# Patient Record
Sex: Female | Born: 1985 | Race: Black or African American | Hispanic: No | Marital: Married | Smoking: Current every day smoker
Health system: Southern US, Community
[De-identification: ages and names within clinical notes are randomized; demographics above are authoritative.]

## PROBLEM LIST (undated history)

## (undated) DIAGNOSIS — I10 Essential (primary) hypertension: Secondary | ICD-10-CM

## (undated) DIAGNOSIS — G039 Meningitis, unspecified: Secondary | ICD-10-CM

## (undated) DIAGNOSIS — O24419 Gestational diabetes mellitus in pregnancy, unspecified control: Secondary | ICD-10-CM

## (undated) HISTORY — DX: Essential (primary) hypertension: I10

## (undated) HISTORY — PX: NO PAST SURGERIES: SHX2092

## (undated) HISTORY — DX: Meningitis, unspecified: G03.9

---

## 2007-10-10 DIAGNOSIS — G039 Meningitis, unspecified: Secondary | ICD-10-CM

## 2007-10-10 HISTORY — DX: Meningitis, unspecified: G03.9

## 2012-10-09 NOTE — L&D Delivery Note (Signed)
Delivery Note At 4:21 AM a viable female was delivered via Vaginal, Spontaneous Delivery (Presentation: Left Occiput Anterior).  APGAR: , ; weight .   Placenta status: , .  Cord: 3 vessels with the following complications: None.  Cord pH: not done  Anesthesia:   Episiotomy: None Lacerations:  Suture Repair: 2.0 Est. Blood Loss (mL):   Mom to postpartum.  Baby to Couplet care / Skin to Skin.  MARSHALL,BERNARD A 08/19/2013, 4:29 AM

## 2013-02-25 ENCOUNTER — Ambulatory Visit (INDEPENDENT_AMBULATORY_CARE_PROVIDER_SITE_OTHER): Payer: BC Managed Care – PPO | Admitting: Obstetrics

## 2013-02-25 ENCOUNTER — Encounter: Payer: Self-pay | Admitting: Obstetrics

## 2013-02-25 VITALS — BP 124/88 | Temp 98.1°F | Ht 62.0 in | Wt 166.8 lb

## 2013-02-25 DIAGNOSIS — N760 Acute vaginitis: Secondary | ICD-10-CM

## 2013-02-25 DIAGNOSIS — Z113 Encounter for screening for infections with a predominantly sexual mode of transmission: Secondary | ICD-10-CM

## 2013-02-25 DIAGNOSIS — Z3401 Encounter for supervision of normal first pregnancy, first trimester: Secondary | ICD-10-CM

## 2013-02-25 DIAGNOSIS — Z124 Encounter for screening for malignant neoplasm of cervix: Secondary | ICD-10-CM

## 2013-02-25 DIAGNOSIS — Z3201 Encounter for pregnancy test, result positive: Secondary | ICD-10-CM

## 2013-02-25 LAB — POCT URINALYSIS DIPSTICK
Leukocytes, UA: NEGATIVE
Urobilinogen, UA: NEGATIVE

## 2013-02-25 NOTE — Progress Notes (Signed)
Pulse- 87 . Subjective:    Leslie Cook is being seen today for her first obstetrical visit.  This is not a planned pregnancy. She is at [redacted]w[redacted]d gestation. Her obstetrical history is significant for none. Relationship with FOB: significant other, living together. Patient does intend to breast feed. Pregnancy history fully reviewed.  Menstrual History: OB History   Grav Para Term Preterm Abortions TAB SAB Ect Mult Living   1               Menarche age: 27 Patient's last menstrual period was 12/09/2012.    The following portions of the patient's history were reviewed and updated as appropriate: allergies, current medications, past family history, past medical history, past social history, past surgical history and problem list.  Review of Systems Pertinent items are noted in HPI.    Objective:    General appearance: alert and no distress Abdomen: normal findings: soft, non-tender Pelvic: cervix normal in appearance, external genitalia normal, no adnexal masses or tenderness, no cervical motion tenderness, vagina normal without discharge and uterus 11 weeks size,soft, nontender.    Assessment:    Pregnancy at [redacted]w[redacted]d weeks    Plan:    Initial labs drawn. Prenatal vitamins. Problem list reviewed and updated. AFP3 discussed: requested. Role of ultrasound in pregnancy discussed; fetal survey: requested. Amniocentesis discussed: not indicated. Follow up in 4 weeks. 50% of 20 min visit spent on counseling and coordination of care.

## 2013-02-26 ENCOUNTER — Encounter: Payer: Self-pay | Admitting: Obstetrics

## 2013-02-26 LAB — OB RESULTS CONSOLE GC/CHLAMYDIA
Chlamydia: NEGATIVE
Gonorrhea: NEGATIVE

## 2013-02-26 LAB — OBSTETRIC PANEL
Basophils Relative: 0 % (ref 0–1)
Eosinophils Absolute: 0 10*3/uL (ref 0.0–0.7)
Eosinophils Relative: 0 % (ref 0–5)
Lymphs Abs: 1.8 10*3/uL (ref 0.7–4.0)
MCH: 30.3 pg (ref 26.0–34.0)
MCHC: 34.1 g/dL (ref 30.0–36.0)
MCV: 88.9 fL (ref 78.0–100.0)
Platelets: 360 10*3/uL (ref 150–400)
RBC: 4.32 MIL/uL (ref 3.87–5.11)
RDW: 12.7 % (ref 11.5–15.5)
Rh Type: POSITIVE

## 2013-02-26 LAB — WET PREP BY MOLECULAR PROBE
Candida species: NEGATIVE
Gardnerella vaginalis: NEGATIVE
Trichomonas vaginosis: NEGATIVE

## 2013-02-26 LAB — PAP IG W/ RFLX HPV ASCU

## 2013-02-26 LAB — HIV ANTIBODY (ROUTINE TESTING W REFLEX): HIV: NONREACTIVE

## 2013-02-27 LAB — CULTURE, OB URINE
Colony Count: NO GROWTH
Organism ID, Bacteria: NO GROWTH

## 2013-02-27 LAB — HEMOGLOBINOPATHY EVALUATION
Hgb A: 97.2 % (ref 96.8–97.8)
Hgb S Quant: 0 %

## 2013-03-18 ENCOUNTER — Ambulatory Visit (INDEPENDENT_AMBULATORY_CARE_PROVIDER_SITE_OTHER): Payer: BC Managed Care – PPO | Admitting: Obstetrics

## 2013-03-18 ENCOUNTER — Encounter: Payer: Self-pay | Admitting: Obstetrics

## 2013-03-18 VITALS — BP 121/77 | Temp 98.2°F | Wt 169.2 lb

## 2013-03-18 DIAGNOSIS — Z3402 Encounter for supervision of normal first pregnancy, second trimester: Secondary | ICD-10-CM

## 2013-03-18 DIAGNOSIS — Z3400 Encounter for supervision of normal first pregnancy, unspecified trimester: Secondary | ICD-10-CM

## 2013-03-18 LAB — POCT URINALYSIS DIPSTICK
Glucose, UA: NEGATIVE
Nitrite, UA: NEGATIVE
Protein, UA: NEGATIVE
Spec Grav, UA: 1.02
Urobilinogen, UA: NEGATIVE

## 2013-03-18 NOTE — Progress Notes (Signed)
Pulse: 85

## 2013-04-01 ENCOUNTER — Ambulatory Visit (INDEPENDENT_AMBULATORY_CARE_PROVIDER_SITE_OTHER): Payer: BC Managed Care – PPO | Admitting: Obstetrics

## 2013-04-01 VITALS — BP 111/86 | Temp 99.2°F | Wt 170.0 lb

## 2013-04-01 DIAGNOSIS — Z3401 Encounter for supervision of normal first pregnancy, first trimester: Secondary | ICD-10-CM

## 2013-04-01 DIAGNOSIS — Z3402 Encounter for supervision of normal first pregnancy, second trimester: Secondary | ICD-10-CM

## 2013-04-01 DIAGNOSIS — Z3400 Encounter for supervision of normal first pregnancy, unspecified trimester: Secondary | ICD-10-CM

## 2013-04-01 DIAGNOSIS — Z369 Encounter for antenatal screening, unspecified: Secondary | ICD-10-CM

## 2013-04-01 LAB — POCT URINALYSIS DIPSTICK
Ketones, UA: NEGATIVE
Urobilinogen, UA: NEGATIVE
pH, UA: 6

## 2013-04-01 NOTE — Progress Notes (Signed)
Pulse 86 patient states she has no concerns today

## 2013-04-02 ENCOUNTER — Encounter: Payer: Self-pay | Admitting: Obstetrics

## 2013-04-02 LAB — AFP, QUAD SCREEN
AFP: 30.9 IU/mL
Curr Gest Age: 16.1 wks.days
Down Syndrome Scr Risk Est: 1:10800 {titer}
HCG, Total: 20720 m[IU]/mL
MoM for hCG: 0.87
Osb Risk: 1:30600 {titer}
Trisomy 18 (Edward) Syndrome Interp.: 1:72000 {titer}
uE3 Value: 0.6 ng/mL

## 2013-04-28 ENCOUNTER — Other Ambulatory Visit: Payer: Self-pay | Admitting: *Deleted

## 2013-04-28 DIAGNOSIS — Z3402 Encounter for supervision of normal first pregnancy, second trimester: Secondary | ICD-10-CM

## 2013-04-29 ENCOUNTER — Ambulatory Visit (INDEPENDENT_AMBULATORY_CARE_PROVIDER_SITE_OTHER): Payer: BC Managed Care – PPO | Admitting: Obstetrics

## 2013-04-29 ENCOUNTER — Encounter: Payer: Self-pay | Admitting: Obstetrics

## 2013-04-29 ENCOUNTER — Ambulatory Visit (INDEPENDENT_AMBULATORY_CARE_PROVIDER_SITE_OTHER): Payer: BC Managed Care – PPO

## 2013-04-29 VITALS — BP 125/84 | Temp 98.4°F | Wt 177.0 lb

## 2013-04-29 DIAGNOSIS — Z3400 Encounter for supervision of normal first pregnancy, unspecified trimester: Secondary | ICD-10-CM

## 2013-04-29 DIAGNOSIS — Z369 Encounter for antenatal screening, unspecified: Secondary | ICD-10-CM

## 2013-04-29 DIAGNOSIS — Z3402 Encounter for supervision of normal first pregnancy, second trimester: Secondary | ICD-10-CM

## 2013-04-29 NOTE — Progress Notes (Signed)
Pulse: 75

## 2013-05-01 ENCOUNTER — Encounter: Payer: Self-pay | Admitting: Obstetrics

## 2013-05-01 LAB — US OB DETAIL + 14 WK

## 2013-05-09 ENCOUNTER — Encounter: Payer: Self-pay | Admitting: Obstetrics

## 2013-05-09 LAB — US OB DETAIL + 14 WK

## 2013-05-12 ENCOUNTER — Inpatient Hospital Stay (HOSPITAL_COMMUNITY)
Admission: AD | Admit: 2013-05-12 | Discharge: 2013-05-12 | Disposition: A | Discharge status: Home / Self Care | Payer: BC Managed Care – PPO | Source: Ambulatory Visit | Attending: Obstetrics & Gynecology | Admitting: Obstetrics & Gynecology

## 2013-05-12 ENCOUNTER — Encounter (HOSPITAL_COMMUNITY): Payer: Self-pay

## 2013-05-12 DIAGNOSIS — O2342 Unspecified infection of urinary tract in pregnancy, second trimester: Secondary | ICD-10-CM

## 2013-05-12 DIAGNOSIS — N390 Urinary tract infection, site not specified: Secondary | ICD-10-CM | POA: Insufficient documentation

## 2013-05-12 DIAGNOSIS — R1032 Left lower quadrant pain: Secondary | ICD-10-CM | POA: Insufficient documentation

## 2013-05-12 DIAGNOSIS — N949 Unspecified condition associated with female genital organs and menstrual cycle: Secondary | ICD-10-CM

## 2013-05-12 DIAGNOSIS — O2390 Unspecified genitourinary tract infection in pregnancy, unspecified trimester: Secondary | ICD-10-CM | POA: Insufficient documentation

## 2013-05-12 LAB — URINALYSIS, ROUTINE W REFLEX MICROSCOPIC
Bilirubin Urine: NEGATIVE
Ketones, ur: NEGATIVE mg/dL
Nitrite: NEGATIVE
Specific Gravity, Urine: 1.025 (ref 1.005–1.030)
Urobilinogen, UA: 0.2 mg/dL (ref 0.0–1.0)

## 2013-05-12 MED ORDER — NITROFURANTOIN MONOHYD MACRO 100 MG PO CAPS: 100.0000 mg | ORAL_CAPSULE | Freq: Two times a day (BID) | ORAL | Status: DC

## 2013-05-12 NOTE — MAU Provider Note (Signed)
History     CSN: [id number]  Arrival date and time: 05/12/13 1202   First Provider Initiated Contact with Patient 05/12/13 1319      Chief Complaint  Patient presents with  . Abdominal Pain   HPI  Pt is a G1P0 at 22.0 wks IUP here with report of pain in LLQ x one week.  She states that prior to today, it was painful to the touch, now its constant and intensifies with walking. She denies vaginal bleeding, lof or abnormal discharge,.  No report of dysuria. + fetal movement.   No complications during pregnancy.   Past Medical History  Diagnosis Date  . Meningitis     Past Surgical History  Procedure Laterality Date  . No past surgeries      Family History  Problem Relation Age of Onset  . Hypertension Mother   . Hypertension Father   . Diabetes Father   . Diabetes Maternal Grandmother     History  Substance Use Topics  . Smoking status: Never Smoker   . Smokeless tobacco: Not on file  . Alcohol Use: No    Allergies: No Known Allergies  Prescriptions prior to admission  Medication Sig Dispense Refill  . Prenatal Vit-Fe Fumarate-FA (PRENATAL MULTIVITAMIN) TABS Take 1 tablet by mouth daily at 12 noon.        Review of Systems  Gastrointestinal: Positive for abdominal pain (left groin pain). Negative for nausea and vomiting.  Genitourinary: Negative.   All other systems reviewed and are negative.   Physical Exam   Blood pressure 133/73, pulse 84, temperature 100.2 F (37.9 C), temperature source Oral, resp. rate 18, height 5\' 2"  (1.575 m), last menstrual period 12/09/2012, SpO2 100.00%.  Physical Exam  Constitutional: She is oriented to person, place, and time. She appears well-developed and well-nourished. No distress.  HENT:  Head: Normocephalic.  Neck: Normal range of motion. Neck supple.  Cardiovascular: Normal rate and regular rhythm.   Murmur (Grade II SEM) heard. Respiratory: Effort normal and breath sounds normal.  GI: Soft. There is no  tenderness.  Genitourinary: No bleeding around the vagina. Vaginal discharge (mucusy) found.  Musculoskeletal: Normal range of motion. She exhibits no edema.  Neurological: She is alert and oriented to person, place, and time.  Skin: Skin is warm and dry.  Dilation: Closed Effacement (%): 50 Cervical Position: Middle Exam by:: Roney Marion, CNM   MAU Course  Procedures Results for orders placed during the hospital encounter of 05/12/13 (from the past 24 hour(s))  URINALYSIS, ROUTINE W REFLEX MICROSCOPIC     Status: Abnormal   Collection Time    05/12/13 12:18 PM      Result Value Range   Color, Urine YELLOW  YELLOW   APPearance CLEAR  CLEAR   Specific Gravity, Urine 1.025  1.005 - 1.030   pH 6.0  5.0 - 8.0   Glucose, UA NEGATIVE  NEGATIVE mg/dL   Hgb urine dipstick NEGATIVE  NEGATIVE   Bilirubin Urine NEGATIVE  NEGATIVE   Ketones, ur NEGATIVE  NEGATIVE mg/dL   Protein, ur NEGATIVE  NEGATIVE mg/dL   Urobilinogen, UA 0.2  0.0 - 1.0 mg/dL   Nitrite NEGATIVE  NEGATIVE   Leukocytes, UA MODERATE (*) NEGATIVE  URINE MICROSCOPIC-ADD ON     Status: Abnormal   Collection Time    05/12/13 12:18 PM      Result Value Range   Squamous Epithelial / LPF FEW (*) RARE   WBC, UA 3-6  <3 WBC/hpf  Bacteria, UA FEW (*) RARE    Assessment and Plan  UTI in Pregnancy Round Ligament Pain  Plan: DC to home RX Macrobid 100 mg BID x 7 days Urine culture Keep scheduled appt with Dr. Clearance Coots   [facility] 05/12/2013, 1:20 PM

## 2013-05-12 NOTE — MAU Note (Signed)
Patient is in with c/o llq to left groin pressure pain that have been going on for a week. She states that prior to today, the pain is to touch, now its constant and intensifies with walking. She denies vaginal bleeding, lof or abnormal discharge, denies dysuria. She reports good fetal movement. Abdomen is soft and non-tender.

## 2013-05-14 LAB — URINE CULTURE

## 2013-05-29 ENCOUNTER — Other Ambulatory Visit: Payer: BC Managed Care – PPO

## 2013-05-29 ENCOUNTER — Encounter: Payer: Self-pay | Admitting: Obstetrics

## 2013-05-29 ENCOUNTER — Ambulatory Visit (INDEPENDENT_AMBULATORY_CARE_PROVIDER_SITE_OTHER): Payer: BC Managed Care – PPO | Admitting: Obstetrics

## 2013-05-29 VITALS — BP 116/79 | Temp 98.7°F | Wt 185.0 lb

## 2013-05-29 DIAGNOSIS — Z3402 Encounter for supervision of normal first pregnancy, second trimester: Secondary | ICD-10-CM

## 2013-05-29 DIAGNOSIS — Z3400 Encounter for supervision of normal first pregnancy, unspecified trimester: Secondary | ICD-10-CM

## 2013-05-29 LAB — CBC
HCT: 34.7 % — ABNORMAL LOW (ref 36.0–46.0)
Hemoglobin: 11.5 g/dL — ABNORMAL LOW (ref 12.0–15.0)
MCH: 29.6 pg (ref 26.0–34.0)
MCHC: 33.1 g/dL (ref 30.0–36.0)

## 2013-05-29 LAB — POCT URINALYSIS DIPSTICK
Blood, UA: NEGATIVE
Nitrite, UA: NEGATIVE
Urobilinogen, UA: NEGATIVE
pH, UA: 8

## 2013-05-29 NOTE — Addendum Note (Signed)
Addended by: George Hugh on: 05/29/2013 01:07 PM   Modules accepted: Orders

## 2013-05-29 NOTE — Progress Notes (Signed)
P- 90 Pt states she has been having some pelvic pain. Pt states it is tender to touch.

## 2013-05-30 LAB — RPR

## 2013-06-12 ENCOUNTER — Encounter: Payer: Self-pay | Admitting: Obstetrics

## 2013-06-12 ENCOUNTER — Ambulatory Visit (INDEPENDENT_AMBULATORY_CARE_PROVIDER_SITE_OTHER): Payer: BC Managed Care – PPO | Admitting: Obstetrics

## 2013-06-12 VITALS — BP 132/82 | Temp 99.1°F | Wt 187.0 lb

## 2013-06-12 DIAGNOSIS — Z3403 Encounter for supervision of normal first pregnancy, third trimester: Secondary | ICD-10-CM

## 2013-06-12 DIAGNOSIS — Z3400 Encounter for supervision of normal first pregnancy, unspecified trimester: Secondary | ICD-10-CM

## 2013-06-12 DIAGNOSIS — Z3402 Encounter for supervision of normal first pregnancy, second trimester: Secondary | ICD-10-CM

## 2013-06-12 LAB — POCT URINALYSIS DIPSTICK
Bilirubin, UA: NEGATIVE
Blood, UA: NEGATIVE
Glucose, UA: NEGATIVE
Nitrite, UA: NEGATIVE
Spec Grav, UA: 1.02

## 2013-06-12 NOTE — Progress Notes (Signed)
P-95 

## 2013-06-30 ENCOUNTER — Encounter: Payer: Self-pay | Admitting: Obstetrics

## 2013-06-30 ENCOUNTER — Ambulatory Visit (INDEPENDENT_AMBULATORY_CARE_PROVIDER_SITE_OTHER): Payer: BC Managed Care – PPO | Admitting: Obstetrics

## 2013-06-30 VITALS — BP 115/77 | Temp 98.8°F | Wt 189.0 lb

## 2013-06-30 DIAGNOSIS — Z23 Encounter for immunization: Secondary | ICD-10-CM

## 2013-06-30 DIAGNOSIS — Z3403 Encounter for supervision of normal first pregnancy, third trimester: Secondary | ICD-10-CM

## 2013-06-30 DIAGNOSIS — Z3400 Encounter for supervision of normal first pregnancy, unspecified trimester: Secondary | ICD-10-CM

## 2013-06-30 LAB — POCT URINALYSIS DIPSTICK
Glucose, UA: NEGATIVE
Spec Grav, UA: 1.02
Urobilinogen, UA: NEGATIVE

## 2013-06-30 NOTE — Progress Notes (Signed)
P 85 Patient request information on natural child birth. Patient had an unusual SOB episode at school today.

## 2013-07-15 ENCOUNTER — Ambulatory Visit (INDEPENDENT_AMBULATORY_CARE_PROVIDER_SITE_OTHER): Payer: BC Managed Care – PPO | Admitting: Obstetrics

## 2013-07-15 ENCOUNTER — Encounter: Payer: Self-pay | Admitting: Obstetrics

## 2013-07-15 VITALS — BP 131/71 | Temp 98.1°F | Wt 190.0 lb

## 2013-07-15 DIAGNOSIS — Z3400 Encounter for supervision of normal first pregnancy, unspecified trimester: Secondary | ICD-10-CM

## 2013-07-15 DIAGNOSIS — Z3403 Encounter for supervision of normal first pregnancy, third trimester: Secondary | ICD-10-CM

## 2013-07-15 LAB — POCT URINALYSIS DIPSTICK
Ketones, UA: NEGATIVE
Nitrite, UA: NEGATIVE
Protein, UA: NEGATIVE
Urobilinogen, UA: NEGATIVE

## 2013-07-15 NOTE — Progress Notes (Signed)
Pulse: 90

## 2013-07-29 ENCOUNTER — Encounter: Payer: BC Managed Care – PPO | Admitting: Obstetrics

## 2013-07-30 ENCOUNTER — Ambulatory Visit (INDEPENDENT_AMBULATORY_CARE_PROVIDER_SITE_OTHER): Payer: BC Managed Care – PPO | Admitting: Obstetrics

## 2013-07-30 ENCOUNTER — Encounter: Payer: Self-pay | Admitting: Obstetrics

## 2013-07-30 VITALS — BP 153/84 | Temp 98.0°F | Wt 190.0 lb

## 2013-07-30 DIAGNOSIS — Z3480 Encounter for supervision of other normal pregnancy, unspecified trimester: Secondary | ICD-10-CM

## 2013-07-30 DIAGNOSIS — Z3483 Encounter for supervision of other normal pregnancy, third trimester: Secondary | ICD-10-CM

## 2013-07-30 LAB — POCT URINALYSIS DIPSTICK
Bilirubin, UA: NEGATIVE
Blood, UA: NEGATIVE
Nitrite, UA: NEGATIVE
Urobilinogen, UA: NEGATIVE
pH, UA: 7

## 2013-07-30 NOTE — Progress Notes (Signed)
P 102 Patient states she felt a little "crampy" yesterday.

## 2013-08-14 ENCOUNTER — Ambulatory Visit (INDEPENDENT_AMBULATORY_CARE_PROVIDER_SITE_OTHER): Payer: BC Managed Care – PPO | Admitting: Obstetrics

## 2013-08-14 ENCOUNTER — Encounter: Payer: Self-pay | Admitting: Obstetrics

## 2013-08-14 VITALS — BP 138/82 | Temp 97.8°F | Wt 200.0 lb

## 2013-08-14 DIAGNOSIS — Z3403 Encounter for supervision of normal first pregnancy, third trimester: Secondary | ICD-10-CM

## 2013-08-14 DIAGNOSIS — Z3400 Encounter for supervision of normal first pregnancy, unspecified trimester: Secondary | ICD-10-CM

## 2013-08-14 LAB — POCT URINALYSIS DIPSTICK
Nitrite, UA: NEGATIVE
Spec Grav, UA: 1.015
Urobilinogen, UA: NEGATIVE
pH, UA: 6

## 2013-08-14 NOTE — Progress Notes (Signed)
Pulse- 94 

## 2013-08-17 ENCOUNTER — Inpatient Hospital Stay (HOSPITAL_COMMUNITY)
Admission: AD | Admit: 2013-08-17 | Discharge: 2013-08-21 | DRG: 775 | Disposition: A | Discharge status: Home / Self Care | Payer: BC Managed Care – PPO | Source: Ambulatory Visit | Attending: Obstetrics | Admitting: Obstetrics

## 2013-08-17 ENCOUNTER — Encounter (HOSPITAL_COMMUNITY): Payer: Self-pay | Admitting: *Deleted

## 2013-08-17 DIAGNOSIS — Z22330 Carrier of Group B streptococcus: Secondary | ICD-10-CM

## 2013-08-17 DIAGNOSIS — O4290 Premature rupture of membranes, unspecified as to length of time between rupture and onset of labor, unspecified weeks of gestation: Principal | ICD-10-CM | POA: Diagnosis present

## 2013-08-17 DIAGNOSIS — O99892 Other specified diseases and conditions complicating childbirth: Secondary | ICD-10-CM | POA: Diagnosis present

## 2013-08-17 LAB — CBC
HCT: 34.5 % — ABNORMAL LOW (ref 36.0–46.0)
MCH: 28.3 pg (ref 26.0–34.0)
MCV: 85 fL (ref 78.0–100.0)
Platelets: 329 10*3/uL (ref 150–400)
RDW: 13.5 % (ref 11.5–15.5)

## 2013-08-17 MED ORDER — FLEET ENEMA 7-19 GM/118ML RE ENEM: 1.0000 | ENEMA | RECTAL | Status: DC | PRN

## 2013-08-17 MED ORDER — IBUPROFEN 600 MG PO TABS
600.0000 mg | ORAL_TABLET | Freq: Four times a day (QID) | ORAL | Status: DC | PRN
  Administered 2013-08-19: 600 mg via ORAL
  Filled 2013-08-17: qty 1

## 2013-08-17 MED ORDER — LIDOCAINE HCL (PF) 1 % IJ SOLN
30.0000 mL | INTRAMUSCULAR | Status: DC | PRN
  Filled 2013-08-17 (×2): qty 30

## 2013-08-17 MED ORDER — LACTATED RINGERS IV SOLN: 500.0000 mL | INTRAVENOUS | Status: DC | PRN

## 2013-08-17 MED ORDER — OXYCODONE-ACETAMINOPHEN 5-325 MG PO TABS: 1.0000 | ORAL_TABLET | ORAL | Status: DC | PRN

## 2013-08-17 MED ORDER — IBUPROFEN 600 MG PO TABS: 600.0000 mg | ORAL_TABLET | Freq: Four times a day (QID) | ORAL | Status: DC | PRN

## 2013-08-17 MED ORDER — CITRIC ACID-SODIUM CITRATE 334-500 MG/5ML PO SOLN: 30.0000 mL | ORAL | Status: DC | PRN

## 2013-08-17 MED ORDER — LIDOCAINE HCL (PF) 1 % IJ SOLN: 30.0000 mL | INTRAMUSCULAR | Status: DC | PRN

## 2013-08-17 MED ORDER — ONDANSETRON HCL 4 MG/2ML IJ SOLN: 4.0000 mg | Freq: Four times a day (QID) | INTRAMUSCULAR | Status: DC | PRN

## 2013-08-17 MED ORDER — PENICILLIN G POTASSIUM 5000000 UNITS IJ SOLR
5.0000 10*6.[IU] | Freq: Once | INTRAVENOUS | Status: AC
  Administered 2013-08-17: 5 10*6.[IU] via INTRAVENOUS
  Filled 2013-08-17: qty 5

## 2013-08-17 MED ORDER — ACETAMINOPHEN 325 MG PO TABS: 650.0000 mg | ORAL_TABLET | ORAL | Status: DC | PRN

## 2013-08-17 MED ORDER — OXYTOCIN BOLUS FROM INFUSION
500.0000 mL | INTRAVENOUS | Status: DC
  Administered 2013-08-19 (×2): 500 mL via INTRAVENOUS

## 2013-08-17 MED ORDER — OXYTOCIN 40 UNITS IN LACTATED RINGERS INFUSION - SIMPLE MED
1.0000 m[IU]/min | INTRAVENOUS | Status: DC
  Administered 2013-08-17: 2 m[IU]/min via INTRAVENOUS
  Filled 2013-08-17: qty 1000

## 2013-08-17 MED ORDER — BUTORPHANOL TARTRATE 1 MG/ML IJ SOLN
1.0000 mg | INTRAMUSCULAR | Status: DC | PRN
  Administered 2013-08-18 – 2013-08-19 (×7): 1 mg via INTRAVENOUS
  Filled 2013-08-17 (×7): qty 1

## 2013-08-17 MED ORDER — LACTATED RINGERS IV SOLN
INTRAVENOUS | Status: DC
  Administered 2013-08-18 – 2013-08-19 (×4): via INTRAVENOUS

## 2013-08-17 MED ORDER — TERBUTALINE SULFATE 1 MG/ML IJ SOLN: 0.2500 mg | Freq: Once | INTRAMUSCULAR | Status: AC | PRN

## 2013-08-17 MED ORDER — OXYTOCIN BOLUS FROM INFUSION: 500.0000 mL | INTRAVENOUS | Status: DC

## 2013-08-17 MED ORDER — LACTATED RINGERS IV SOLN
INTRAVENOUS | Status: DC
  Administered 2013-08-17: 22:00:00 via INTRAVENOUS

## 2013-08-17 MED ORDER — OXYTOCIN 40 UNITS IN LACTATED RINGERS INFUSION - SIMPLE MED
62.5000 mL/h | INTRAVENOUS | Status: DC
  Filled 2013-08-17: qty 1000

## 2013-08-17 MED ORDER — OXYTOCIN 40 UNITS IN LACTATED RINGERS INFUSION - SIMPLE MED: 62.5000 mL/h | INTRAVENOUS | Status: DC

## 2013-08-17 MED ORDER — PENICILLIN G POTASSIUM 5000000 UNITS IJ SOLR
2.5000 10*6.[IU] | INTRAVENOUS | Status: DC
  Administered 2013-08-18 – 2013-08-19 (×7): 2.5 10*6.[IU] via INTRAVENOUS
  Filled 2013-08-17 (×10): qty 2.5

## 2013-08-17 NOTE — H&P (Signed)
Leslie Cook is a 27 y.o. female presenting for rupture of membranes. Maternal Medical History:  Reason for admission: Rupture of membranes and nausea.   Contractions: Frequency: irregular.    Fetal activity: Perceived fetal activity is normal.    Prenatal complications: Infection: asymptomatic bacteruria.   Prenatal Complications - Diabetes: none.    OB History   Grav Para Term Preterm Abortions TAB SAB Ect Mult Living   1              Past Medical History  Diagnosis Date  . Meningitis    Past Surgical History  Procedure Laterality Date  . No past surgeries     Family History: family history includes Diabetes in her father and maternal grandmother; Hypertension in her father and mother. Social History:  reports that she has never smoked. She does not have any smokeless tobacco history on file. She reports that she does not drink alcohol or use illicit drugs.     Review of Systems  Constitutional: Negative for fever.  Eyes: Negative for blurred vision.  Respiratory: Negative for shortness of breath.   Gastrointestinal: Positive for nausea. Negative for vomiting.  Skin: Negative for rash.  Neurological: Negative for headaches.    Dilation: 1 Effacement (%): Thick Station: -3 Exam by:: Thressa Sheller, CNM Blood pressure 132/84, pulse 104, temperature 98.7 F (37.1 C), temperature source Oral, resp. rate 18, height 5\' 2"  (1.575 m), weight 89.086 kg (196 lb 6.4 oz), last menstrual period 12/09/2012. Maternal Exam:  Uterine Assessment: Contraction frequency is irregular.   Abdomen: Fetal presentation: vertex  Introitus: Ferning test: positive.  Amniotic fluid character: bloody.  Cervix: Cervix evaluated by digital exam.     Fetal Exam Fetal Monitor Review: Variability: moderate (6-25 bpm).   Pattern: accelerations present and no decelerations.    Fetal State Assessment: Category I - tracings are normal.     Physical Exam  Constitutional: She appears  well-developed.  HENT:  Head: Normocephalic.  Neck: Neck supple. No thyromegaly present.  Cardiovascular: Normal rate and regular rhythm.   Respiratory: Breath sounds normal.  GI: Soft. Bowel sounds are normal.  Skin: No rash noted.    Prenatal labs: ABO, Rh: A/POS/-- (05/20 1327) Antibody: NEG (05/20 1327) Rubella: 6.02 (05/20 1327) RPR: NON REAC (08/21 1343)  HBsAg: NEGATIVE (05/20 1327)  HIV: NON REACTIVE (08/21 1343)  GBS: POSITIVE (11/06 1604)   Assessment/Plan: [place] @ [redacted]w[redacted]d.  PPROM.  GBS positive.  Category I FHT.  Admit PCN GBS prophylaxis Augmentation of labor with low dose Pitocin per protocol   JACKSON-MOORE,Jilian West A 08/17/2013, 9:24 PM

## 2013-08-17 NOTE — Treatment Plan (Signed)
Dr Tamela Oddi notified of patients status, gestational age, cervical exam, + fern, Orders to admit to [facility]

## 2013-08-17 NOTE — MAU Provider Note (Signed)
  History     CSN: [id number]  Arrival date and time: 08/17/13 1928   None     Chief Complaint  Patient presents with  . Rupture of Membranes   HPI  Leslie Cook is a 27 y.o. G1P0 at [redacted]w[redacted]d who presents today for ROM. She states that 1915 she had a large gush of clear fluid. Contractions started shortly afterward. She states that it was clear, but not it is pinkish/blood tinged.   Past Medical History  Diagnosis Date  . Meningitis     Past Surgical History  Procedure Laterality Date  . No past surgeries      Family History  Problem Relation Age of Onset  . Hypertension Mother   . Hypertension Father   . Diabetes Father   . Diabetes Maternal Grandmother     History  Substance Use Topics  . Smoking status: Never Smoker   . Smokeless tobacco: Not on file  . Alcohol Use: No    Allergies: No Known Allergies  Prescriptions prior to admission  Medication Sig Dispense Refill  . Prenatal Vit-Fe Fumarate-FA (PRENATAL MULTIVITAMIN) TABS Take 1 tablet by mouth daily at 12 noon.        ROS Physical Exam   Blood pressure 132/84, pulse 104, temperature 98.7 F (37.1 C), temperature source Oral, resp. rate 18, height 5\' 2"  (1.575 m), weight 89.086 kg (196 lb 6.4 oz), last menstrual period 12/09/2012.  Physical Exam  Constitutional: She is oriented to person, place, and time. She appears well-developed and well-nourished. No distress.  Cardiovascular: Normal rate.   Respiratory: Effort normal.  GI: Soft.  Genitourinary:   External: no lesion Vagina: pooling of pink/clear fluid Cervix: fluid seen with valsalva, 1/thick/high Uterus: AGA FHt: 145, moderate with 15x15 accels, no decels Toco: q4-6 mins    Neurological: She is alert and oriented to person, place, and time.  Skin: Skin is warm and dry.    MAU Course  Procedures  Bedside ultrasound with Dr. Ike Bene, confirms vertex.   Assessment and Plan  SROM Admit to labor and delivery    Tawnya Crook 08/17/2013, 8:47 PM

## 2013-08-17 NOTE — MAU Note (Signed)
Leaking clear fluid since 7pm, some bloody streaks. Positive fetal movement. Denies contractions. Denies complications with pregnancy.

## 2013-08-18 ENCOUNTER — Encounter (HOSPITAL_COMMUNITY): Payer: Self-pay | Admitting: *Deleted

## 2013-08-18 LAB — COMPREHENSIVE METABOLIC PANEL
AST: 19 U/L (ref 0–37)
BUN: 6 mg/dL (ref 6–23)
CO2: 20 mEq/L (ref 19–32)
Calcium: 9.7 mg/dL (ref 8.4–10.5)
Chloride: 100 mEq/L (ref 96–112)
Creatinine, Ser: 0.68 mg/dL (ref 0.50–1.10)
GFR calc non Af Amer: >90 mL/min (ref >90)
Sodium: 132 mEq/L — ABNORMAL LOW (ref 135–145)
Total Bilirubin: 0.3 mg/dL (ref 0.3–1.2)
Total Protein: 7.2 g/dL (ref 6.0–8.3)

## 2013-08-18 LAB — TYPE AND SCREEN: Antibody Screen: NEGATIVE

## 2013-08-18 LAB — RPR: RPR Ser Ql: NONREACTIVE

## 2013-08-18 LAB — SAVE SMEAR

## 2013-08-18 LAB — LACTATE DEHYDROGENASE: LDH: 225 U/L (ref 94–250)

## 2013-08-18 MED ORDER — PHENYLEPHRINE 40 MCG/ML (10ML) SYRINGE FOR IV PUSH (FOR BLOOD PRESSURE SUPPORT)
80.0000 ug | PREFILLED_SYRINGE | INTRAVENOUS | Status: DC | PRN
  Filled 2013-08-18: qty 2

## 2013-08-18 MED ORDER — EPHEDRINE 5 MG/ML INJ
10.0000 mg | INTRAVENOUS | Status: DC | PRN
  Filled 2013-08-18: qty 2

## 2013-08-18 MED ORDER — DIPHENHYDRAMINE HCL 50 MG/ML IJ SOLN: 12.5000 mg | INTRAMUSCULAR | Status: DC | PRN

## 2013-08-18 MED ORDER — LACTATED RINGERS IV SOLN: 500.0000 mL | Freq: Once | INTRAVENOUS | Status: DC

## 2013-08-18 MED ORDER — FENTANYL 2.5 MCG/ML BUPIVACAINE 1/10 % EPIDURAL INFUSION (WH - ANES): 14.0000 mL/h | INTRAMUSCULAR | Status: DC | PRN

## 2013-08-18 NOTE — Progress Notes (Signed)
Leslie Cook is a 27 y.o. G1P0000 at [redacted]w[redacted]d by LMP admitted for rupture of membranes  Subjective: Uncomfortable  Objective: BP 144/79  Pulse 87  Temp(Src) 99.5 F (37.5 C) (Oral)  Resp 20  Ht 5\' 2"  (1.575 m)  Wt 89.086 kg (196 lb 6.4 oz)  BMI 35.91 kg/m2  LMP 12/09/2012      FHT:  FHR: 140 bpm, variability: moderate,  accelerations:  Present,  decelerations:  Absent UC:   regular, every 3 minutes, occasional coupling SVE:   Dilation: 5 Effacement (%): 100 Station: 0 Exam by:: Enis Slipper, RN  Labs: Lab Results  Component Value Date   WBC 10.0 08/17/2013   HGB 11.5* 08/17/2013   HCT 34.5* 08/17/2013   MCV 85.0 08/17/2013   PLT 329 08/17/2013    Assessment / Plan: Latent labor  Labor: see above Preeclampsia: B/Ps elevated, ?pain related Fetal Wellbeing:  Category I Pain Control:  Stadol I/D:  no signs/symptoms of chorioamnionitis Anticipated MOD:  NSVD  Check PIH labs  JACKSON-MOORE,Cher Egnor A 08/18/2013, 4:16 PM

## 2013-08-18 NOTE — Progress Notes (Signed)
Leslie Cook is a 27 y.o. G1P0000 at [redacted]w[redacted]d by LMP admitted for rupture of membranes  Subjective: Uncomfortable  Objective: BP 133/75  Pulse 68  Temp(Src) 97.8 F (36.6 C) (Oral)  Resp 18  Ht 5\' 2"  (1.575 m)  Wt 89.086 kg (196 lb 6.4 oz)  BMI 35.91 kg/m2  LMP 12/09/2012      FHT:  FHR: 140 bpm, variability: moderate,  accelerations:  Present,  decelerations:  Absent UC:   regular, every 3 minutes, occasional coupling SVE:   Dilation: 3 Effacement (%): 70 Station: -2 Exam by:: S Nix RN  Labs: Lab Results  Component Value Date   WBC 10.0 08/17/2013   HGB 11.5* 08/17/2013   HCT 34.5* 08/17/2013   MCV 85.0 08/17/2013   PLT 329 08/17/2013    Assessment / Plan: Latent labor  Labor: see above Preeclampsia:  n/a Fetal Wellbeing:  Category I Pain Control:  Stadol I/D:  no signs/symptoms of chorioamnionitis Anticipated MOD:  NSVD  JACKSON-MOORE,Carlee Tesfaye A 08/18/2013, 8:36 AM

## 2013-08-19 ENCOUNTER — Encounter (HOSPITAL_COMMUNITY): Payer: Self-pay

## 2013-08-19 ENCOUNTER — Encounter: Payer: Self-pay | Admitting: Obstetrics

## 2013-08-19 ENCOUNTER — Encounter: Payer: BC Managed Care – PPO | Admitting: Obstetrics

## 2013-08-19 LAB — CBC
HCT: 30.5 % — ABNORMAL LOW (ref 36.0–46.0)
MCHC: 32.8 g/dL (ref 30.0–36.0)
MCV: 85.9 fL (ref 78.0–100.0)
Platelets: 265 10*3/uL (ref 150–400)
RBC: 3.55 MIL/uL — ABNORMAL LOW (ref 3.87–5.11)
RDW: 13.6 % (ref 11.5–15.5)
WBC: 18 10*3/uL — ABNORMAL HIGH (ref 4.0–10.5)

## 2013-08-19 MED ORDER — BENZOCAINE-MENTHOL 20-0.5 % EX AERO: 1.0000 "application " | INHALATION_SPRAY | CUTANEOUS | Status: DC | PRN

## 2013-08-19 MED ORDER — IBUPROFEN 600 MG PO TABS
600.0000 mg | ORAL_TABLET | Freq: Four times a day (QID) | ORAL | Status: DC
  Administered 2013-08-19 – 2013-08-21 (×9): 600 mg via ORAL
  Filled 2013-08-19 (×9): qty 1

## 2013-08-19 MED ORDER — SIMETHICONE 80 MG PO CHEW: 80.0000 mg | CHEWABLE_TABLET | ORAL | Status: DC | PRN

## 2013-08-19 MED ORDER — WITCH HAZEL-GLYCERIN EX PADS: 1.0000 "application " | MEDICATED_PAD | CUTANEOUS | Status: DC | PRN

## 2013-08-19 MED ORDER — SENNOSIDES-DOCUSATE SODIUM 8.6-50 MG PO TABS
2.0000 | ORAL_TABLET | ORAL | Status: DC
  Administered 2013-08-20 (×2): 2 via ORAL
  Filled 2013-08-19 (×2): qty 2

## 2013-08-19 MED ORDER — ZOLPIDEM TARTRATE 5 MG PO TABS: 5.0000 mg | ORAL_TABLET | Freq: Every evening | ORAL | Status: DC | PRN

## 2013-08-19 MED ORDER — DIBUCAINE 1 % RE OINT: 1.0000 "application " | TOPICAL_OINTMENT | RECTAL | Status: DC | PRN

## 2013-08-19 MED ORDER — TETANUS-DIPHTH-ACELL PERTUSSIS 5-2.5-18.5 LF-MCG/0.5 IM SUSP
0.5000 mL | Freq: Once | INTRAMUSCULAR | Status: AC
  Administered 2013-08-20: 0.5 mL via INTRAMUSCULAR
  Filled 2013-08-19: qty 0.5

## 2013-08-19 MED ORDER — FERROUS SULFATE 325 (65 FE) MG PO TABS
325.0000 mg | ORAL_TABLET | Freq: Two times a day (BID) | ORAL | Status: DC
  Administered 2013-08-19 – 2013-08-21 (×4): 325 mg via ORAL
  Filled 2013-08-19 (×5): qty 1

## 2013-08-19 MED ORDER — ONDANSETRON HCL 4 MG/2ML IJ SOLN: 4.0000 mg | INTRAMUSCULAR | Status: DC | PRN

## 2013-08-19 MED ORDER — PRENATAL MULTIVITAMIN CH
1.0000 | ORAL_TABLET | Freq: Every day | ORAL | Status: DC
  Administered 2013-08-19 – 2013-08-21 (×3): 1 via ORAL
  Filled 2013-08-19 (×3): qty 1

## 2013-08-19 MED ORDER — ONDANSETRON HCL 4 MG PO TABS: 4.0000 mg | ORAL_TABLET | ORAL | Status: DC | PRN

## 2013-08-19 MED ORDER — DIPHENHYDRAMINE HCL 25 MG PO CAPS: 25.0000 mg | ORAL_CAPSULE | Freq: Four times a day (QID) | ORAL | Status: DC | PRN

## 2013-08-19 MED ORDER — OXYCODONE-ACETAMINOPHEN 5-325 MG PO TABS: 1.0000 | ORAL_TABLET | ORAL | Status: DC | PRN

## 2013-08-19 MED ORDER — LANOLIN HYDROUS EX OINT: TOPICAL_OINTMENT | CUTANEOUS | Status: DC | PRN

## 2013-08-20 NOTE — Progress Notes (Signed)
Patient ID: Leslie Cook, female   DOB: 06-24-1986, 27 y.o.   MRN: [id number] Post Partum Day 1 S/P induced vaginal RH status/Rubella reviewed.  Feeding: breast Subjective: No HA, SOB, CP, F/C, breast symptoms. Normal vaginal bleeding, no clots.     Objective: BP 122/67  Pulse 73  Temp(Src) 98.6 F (37 C) (Oral)  Resp 16  Ht 5\' 2"  (1.575 m)  Wt 89.086 kg (196 lb 6.4 oz)  BMI 35.91 kg/m2  SpO2 98%  LMP 12/09/2012   Physical Exam:  General: alert Lochia: appropriate Uterine Fundus: firm DVT Evaluation: No evidence of DVT seen on physical exam. Ext: No c/c/e  Recent Labs  08/17/13 2130 08/19/13 0649  HGB 11.5* 10.0*  HCT 34.5* 30.5*      Assessment/Plan: 27 y.o.  PPD #1 .  normal postpartum exam Continue current postpartum care Ambulate   LOS: 3 days   JACKSON-MOORE,Leslie Cook 08/20/2013, 8:20 AM

## 2013-08-21 ENCOUNTER — Encounter (HOSPITAL_COMMUNITY)
Admission: RE | Admit: 2013-08-21 | Discharge: 2013-08-21 | Disposition: A | Discharge status: Home / Self Care | Payer: BC Managed Care – PPO | Source: Ambulatory Visit | Attending: Obstetrics | Admitting: Obstetrics

## 2013-08-21 ENCOUNTER — Ambulatory Visit: Payer: Self-pay

## 2013-08-21 DIAGNOSIS — O923 Agalactia: Secondary | ICD-10-CM | POA: Insufficient documentation

## 2013-08-21 MED ORDER — IBUPROFEN 600 MG PO TABS: 600.0000 mg | ORAL_TABLET | Freq: Four times a day (QID) | ORAL | Status: DC | PRN

## 2013-08-21 MED ORDER — OXYCODONE-ACETAMINOPHEN 5-325 MG PO TABS: 1.0000 | ORAL_TABLET | ORAL | Status: DC | PRN

## 2013-08-21 NOTE — Discharge Summary (Signed)
Obstetric Discharge Summary Reason for Admission: induction of labor Prenatal Procedures: ultrasound Intrapartum Procedures: spontaneous vaginal delivery Postpartum Procedures: none Complications-Operative and Postpartum: none Hemoglobin  Date Value Range Status  08/19/2013 10.0* 12.0 - 15.0 g/dL Final     HCT  Date Value Range Status  08/19/2013 30.5* 36.0 - 46.0 % Final    Physical Exam:  General: alert and no distress Lochia: appropriate Uterine Fundus: firm Incision: healing well DVT Evaluation: No evidence of DVT seen on physical exam.  Discharge Diagnoses: Term Pregnancy-delivered  Discharge Information: Date: 08/21/2013 Activity: pelvic rest Diet: routine Medications: PNV, Ibuprofen, Colace and Percocet Condition: stable Instructions: refer to practice specific booklet Discharge to: home Follow-up Information   Follow up with HARPER,CHARLES A, MD In 2 weeks.   Specialty:  Obstetrics and Gynecology   Contact information:   [address] [place] [address] [phone]       Newborn Data: Live born female  Birth Weight: 5 lb 15.8 oz (2716 g) APGAR: 9, 9  Home with mother.  HARPER,CHARLES A 08/21/2013, 8:44 AM

## 2013-08-21 NOTE — Progress Notes (Signed)
Post Partum Day 2 Subjective: no complaints, up ad lib, voiding, tolerating PO and + flatus  Objective: Blood pressure 130/67, pulse 80, temperature 98.3 F (36.8 C), temperature source Oral, resp. rate 17, height 5\' 2"  (1.575 m), weight 196 lb 6.4 oz (89.086 kg), last menstrual period 12/09/2012, SpO2 100.00%, unknown if currently breastfeeding.  Physical Exam:  General: alert and no distress Lochia: appropriate Uterine Fundus: firm Incision: healing well DVT Evaluation: No evidence of DVT seen on physical exam.   Recent Labs  08/19/13 0649  HGB 10.0*  HCT 30.5*    Assessment/Plan: Discharge home   LOS: 4 days   Nimah Uphoff A 08/21/2013, 8:39 AM

## 2013-08-21 NOTE — Lactation Note (Signed)
This note was copied from the chart of Leslie Tomesha Sargent. Lactation Consultation Note    Follow up consult with this mom of a NICU baby, now 36 3/7 weeks corrected gestation, and 59 hours pot partum. Mom is being dishcarged  To home today, and I rented her a DEP, and instructed her in it's use. Mom reports she has not expressed any colostrum, so I reviewed hand expression with mom, and she was able to hand express about 2 mls of transitional milk. I the had her pump in standard setting , and found she had not been increasing the suction strength. With the suction increased to half way, she expressed an additional 3-4 mls of milk, making her total about 6 -7 mls. She was so happy, stating. "I now feel confident". I reviewed the NICU book on providing EBM for a NICU baby, and advised her to watch the video on hand expression. Part care and frequency and duration reviewed. Mom aware I will help transition her and baby to breast feeding, both in the NICU and in o/p lactation after baby's discharge.  Patient Name: Leslie Cook [id number] Date: 08/21/2013 Reason for consult: Follow-up assessment;NICU baby   Maternal Data    Feeding Feeding Type: Formula Length of feed: 30 min  LATCH Score/Interventions                      Lactation Tools Discussed/Used WIC Program: No (mom is going to call and makd an appointment to aply) Pump Review: Setup, frequency, and cleaning;Milk Storage;Other (comment) (hand expression) Date initiated::  (DEP  begun prior to baby going to NICU, to supplement baby )   Consult Status Consult Status: Follow-up Follow-up type:  (prn in NICU)    Alfred Levins 08/21/2013, 3:37 PM

## 2013-08-25 ENCOUNTER — Encounter: Payer: Self-pay | Admitting: Obstetrics

## 2013-09-08 ENCOUNTER — Encounter: Payer: Self-pay | Admitting: Obstetrics

## 2013-09-08 ENCOUNTER — Ambulatory Visit (INDEPENDENT_AMBULATORY_CARE_PROVIDER_SITE_OTHER): Payer: BC Managed Care – PPO | Admitting: Obstetrics

## 2013-09-08 DIAGNOSIS — Z3009 Encounter for other general counseling and advice on contraception: Secondary | ICD-10-CM

## 2013-09-08 MED ORDER — NORETHINDRONE 0.35 MG PO TABS: 1.0000 | ORAL_TABLET | Freq: Every day | ORAL | Status: DC

## 2013-09-08 NOTE — Progress Notes (Signed)
Subjective:     Leslie Cook is a 27 y.o. female who presents for a postpartum visit. She is 3 weeks postpartum following a spontaneous vaginal delivery. I have fully reviewed the prenatal and intrapartum course. The delivery was at 36 gestational weeks. Outcome: spontaneous vaginal delivery. Anesthesia: none. Postpartum course has been doing well. Baby's course has been doing well. Baby was in NICU for 1 week post delivery. Baby is feeding by breast. Bleeding no bleeding. Bowel function is having some constipation. Pt would like to discuss options for stool softners. Bladder function is normal. Patient is not sexually active. Contraception method is abstinence. Postpartum depression screening: negative.  The following portions of the patient's history were reviewed and updated as appropriate: allergies, current medications, past family history, past medical history, past social history, past surgical history and problem list.  Review of Systems Pertinent items are noted in HPI.   Objective:    BP 125/85  Pulse 63  Temp(Src) 98.7 F (37.1 C)  Wt 177 lb (80.287 kg)  Breastfeeding? Yes  General:  alert and no distress  PE:  Deferred                                    Assessment:    Doing well.   Contraceptive counseling done.  Plan:    1. Contraception: oral progesterone-only contraceptive 2. Breast feeding promoted. 3. Follow up in: 6 weeks or as needed.

## 2013-10-07 ENCOUNTER — Ambulatory Visit (INDEPENDENT_AMBULATORY_CARE_PROVIDER_SITE_OTHER): Payer: BC Managed Care – PPO | Admitting: Obstetrics

## 2013-10-07 ENCOUNTER — Encounter: Payer: Self-pay | Admitting: Obstetrics

## 2013-10-07 DIAGNOSIS — Z3009 Encounter for other general counseling and advice on contraception: Secondary | ICD-10-CM

## 2013-10-07 NOTE — Progress Notes (Signed)
Subjective:     Leslie Cook is a 27 y.o. female who presents for a postpartum visit. She is 7 weeks postpartum following a spontaneous vaginal delivery. I have fully reviewed the prenatal and intrapartum course. The delivery was at 36 gestational weeks. Outcome: spontaneous vaginal delivery. Anesthesia: IV sedation. Postpartum course has been WNL. Baby's course has been WNL. Baby is feeding by breast. Bleeding no bleeding. Bowel function is normal. Bladder function is normal. Patient is not sexually active. Contraception method is abstinence and oral progesterone-only contraceptive. Postpartum depression screening: negative.  The following portions of the patient's history were reviewed and updated as appropriate: allergies, current medications, past family history, past medical history, past social history, past surgical history and problem list.  Review of Systems Pertinent items are noted in HPI.   Objective:    BP 125/67  Pulse 81  Temp(Src) 98.7 F (37.1 C)  Ht 5\' 2"  (1.575 m)  Wt 175 lb (79.379 kg)  BMI 32.00 kg/m2  Breastfeeding? Yes  General:  alert and no distress   Breasts:  inspection negative, no nipple discharge or bleeding, no masses or nodularity palpable Abdomen:  Soft, NT. Pelvic:  NEFG.  Uterus NSSC, NT.   Assessment:     Normal postpartum exam. Pap smear not done at today's visit.   Plan:    1. Contraception: abstinence 2. Contraceptive options discussed. 3. Follow up in: 6 weeks or as needed.

## 2014-02-05 ENCOUNTER — Ambulatory Visit: Payer: BC Managed Care – PPO | Admitting: Obstetrics

## 2014-03-30 ENCOUNTER — Ambulatory Visit (INDEPENDENT_AMBULATORY_CARE_PROVIDER_SITE_OTHER): Payer: BC Managed Care – PPO | Admitting: Obstetrics

## 2014-03-30 ENCOUNTER — Encounter: Payer: Self-pay | Admitting: Obstetrics

## 2014-03-30 VITALS — BP 123/80 | HR 80 | Temp 98.8°F | Wt 169.0 lb

## 2014-03-30 DIAGNOSIS — Z01419 Encounter for gynecological examination (general) (routine) without abnormal findings: Secondary | ICD-10-CM

## 2014-03-30 DIAGNOSIS — Z3041 Encounter for surveillance of contraceptive pills: Secondary | ICD-10-CM

## 2014-03-30 NOTE — Progress Notes (Signed)
Subjective:     Leslie Cook is a 28 y.o. female here for a routine exam.  Current complaints: None.    Personal health questionnaire:  Is patient Leslie Cook Jewish, have a family history of breast and/or ovarian cancer: no Is there a family history of uterine cancer diagnosed at age < 6550, gastrointestinal cancer, urinary tract cancer, family member who is a Personnel officerLynch syndrome-associated carrier: no Is the patient overweight and hypertensive, family history of diabetes, personal history of gestational diabetes or PCOS: no Is patient over 5855, have PCOS,  family history of premature CHD under age 26, diabetes, smoke, have hypertension or peripheral artery disease:  no  The HPI was reviewed and explored in further detail by the provider. Gynecologic History No LMP recorded. Patient is not currently having periods (Reason: Lactating). Contraception: oral progesterone-only contraceptive Last Pap: unknown. Results were: normal Last mammogram: n/a. Results were: n/a  Obstetric History OB History  Gravida Para Term Preterm AB SAB TAB Ectopic Multiple Living  1 1 0 1 0 0 0 0 0 1     # Outcome Date GA Lbr Len/2nd Weight Sex Delivery Anes PTL Lv  1 PRE 08/19/13 [id number] 21:39 / 00:07 5 lb 15.8 oz (2.716 kg) M SVD None  Y      Past Medical History  Diagnosis Date  . Meningitis 2009    Past Surgical History  Procedure Laterality Date  . No past surgeries      Current outpatient prescriptions:norethindrone (MICRONOR,CAMILA,ERRIN) 0.35 MG tablet, Take 1 tablet (0.35 mg total) by mouth daily., Disp: 1 Package, Rfl: 11;  Prenatal Vit-Fe Fumarate-FA (PRENATAL MULTIVITAMIN) TABS, Take 1 tablet by mouth daily at 12 noon., Disp: , Rfl:  No Known Allergies  History  Substance Use Topics  . Smoking status: Former Smoker    Quit date: 10/07/2012  . Smokeless tobacco: Not on file  . Alcohol Use: No    Family History  Problem Relation Age of Onset  . Hypertension Mother   . Hypertension Father   .  Diabetes Father   . Diabetes Maternal Grandmother       Review of Systems  Constitutional: negative for fatigue and weight loss Respiratory: negative for cough and wheezing Cardiovascular: negative for chest pain, fatigue and palpitations Gastrointestinal: negative for abdominal pain and change in bowel habits Musculoskeletal:negative for myalgias Neurological: negative for gait problems and tremors Behavioral/Psych: negative for abusive relationship, depression Endocrine: negative for temperature intolerance   Genitourinary:negative for abnormal menstrual periods, genital lesions, hot flashes, sexual problems and vaginal discharge Integument/breast: negative for breast lump, breast tenderness, nipple discharge and skin lesion(s)    Objective:       BP 123/80  Pulse 80  Temp(Src) 98.8 F (37.1 C)  Wt 169 lb (76.658 kg)  Breastfeeding? Yes General:   alert  Skin:   no rash or abnormalities  Lungs:   clear to auscultation bilaterally  Heart:   regular rate and rhythm, S1, S2 normal, no murmur, click, rub or gallop  Breasts:   normal without suspicious masses, skin or nipple changes or axillary nodes  Abdomen:  normal findings: no organomegaly, soft, non-tender and no hernia  Pelvis:  External genitalia: normal general appearance Urinary system: urethral meatus normal and bladder without fullness, nontender Vaginal: normal without tenderness, induration or masses Cervix: normal appearance Adnexa: normal bimanual exam Uterus: anteverted and non-tender, normal size   Lab Review Urine pregnancy test Labs reviewed yes Radiologic studies reviewed no   Assessment:    Healthy female  exam.   Breast feeding.  Plans to stop in a few months.  Wants to start combination OCP's after breast feeding. Plan:    Education reviewed: calcium supplements, low fat, low cholesterol diet and weight bearing exercise. Contraception: OCP (estrogen/progesterone). Follow up in: 1 year.   No  orders of the defined types were placed in this encounter.   Orders Placed This Encounter  Procedures  . WET PREP BY MOLECULAR PROBE

## 2014-03-31 LAB — PAP IG W/ RFLX HPV ASCU

## 2014-03-31 LAB — WET PREP BY MOLECULAR PROBE
CANDIDA SPECIES: NEGATIVE
GARDNERELLA VAGINALIS: POSITIVE — AB
Trichomonas vaginosis: NEGATIVE

## 2014-07-20 ENCOUNTER — Telehealth: Payer: Self-pay | Admitting: *Deleted

## 2014-07-20 NOTE — Telephone Encounter (Signed)
Patient states she has stopped breast feeding and wants to go back to her [facility].

## 2014-07-21 ENCOUNTER — Other Ambulatory Visit: Payer: Self-pay | Admitting: Obstetrics

## 2014-07-21 DIAGNOSIS — Z30011 Encounter for initial prescription of contraceptive pills: Secondary | ICD-10-CM

## 2014-07-21 MED ORDER — NORGESTIM-ETH ESTRAD TRIPHASIC 0.18/0.215/0.25 MG-25 MCG PO TABS: 1.0000 | ORAL_TABLET | Freq: Every day | ORAL | Status: DC

## 2014-07-21 NOTE — Telephone Encounter (Signed)
Patient notified- Rx sent to pharmacy per Dr Clearance CootsHarper.

## 2014-07-21 NOTE — Telephone Encounter (Signed)
OCP Rx.

## 2014-08-10 ENCOUNTER — Encounter: Payer: Self-pay | Admitting: Obstetrics

## 2015-06-25 ENCOUNTER — Other Ambulatory Visit: Payer: Self-pay | Admitting: Obstetrics

## 2015-06-28 ENCOUNTER — Telehealth: Payer: Self-pay | Admitting: *Deleted

## 2015-06-28 DIAGNOSIS — Z3041 Encounter for surveillance of contraceptive pills: Secondary | ICD-10-CM

## 2015-06-28 MED ORDER — NORGESTIM-ETH ESTRAD TRIPHASIC 0.18/0.215/0.25 MG-25 MCG PO TABS: 1.0000 | ORAL_TABLET | Freq: Every day | ORAL | Status: DC

## 2015-06-28 NOTE — Telephone Encounter (Signed)
Patient wants a refill of her Ortho Lo - #3 cycles sent to the pharmacy- she needs annual exam.  Call forwarded to [facility] to schedule.

## 2016-04-03 ENCOUNTER — Emergency Department (HOSPITAL_COMMUNITY)
Admission: EM | Admit: 2016-04-03 | Discharge: 2016-04-03 | Disposition: A | Discharge status: Home / Self Care | Payer: BC Managed Care – PPO | Attending: Emergency Medicine | Admitting: Emergency Medicine

## 2016-04-03 ENCOUNTER — Emergency Department (HOSPITAL_COMMUNITY): Payer: BC Managed Care – PPO

## 2016-04-03 ENCOUNTER — Encounter (HOSPITAL_COMMUNITY): Payer: Self-pay | Admitting: Emergency Medicine

## 2016-04-03 ENCOUNTER — Other Ambulatory Visit: Payer: Self-pay

## 2016-04-03 DIAGNOSIS — Z87891 Personal history of nicotine dependence: Secondary | ICD-10-CM | POA: Diagnosis not present

## 2016-04-03 DIAGNOSIS — R0602 Shortness of breath: Secondary | ICD-10-CM | POA: Diagnosis not present

## 2016-04-03 DIAGNOSIS — R079 Chest pain, unspecified: Secondary | ICD-10-CM | POA: Insufficient documentation

## 2016-04-03 LAB — CBC
HCT: 39.8 % (ref 36.0–46.0)
HEMOGLOBIN: 13.3 g/dL (ref 12.0–15.0)
MCH: 29.5 pg (ref 26.0–34.0)
MCHC: 33.4 g/dL (ref 30.0–36.0)
MCV: 88.2 fL (ref 78.0–100.0)
PLATELETS: 347 10*3/uL (ref 150–400)
RBC: 4.51 MIL/uL (ref 3.87–5.11)
RDW: 12.7 % (ref 11.5–15.5)
WBC: 9.8 10*3/uL (ref 4.0–10.5)

## 2016-04-03 LAB — BASIC METABOLIC PANEL
ANION GAP: 6 (ref 5–15)
BUN: 9 mg/dL (ref 6–20)
CALCIUM: 9.5 mg/dL (ref 8.9–10.3)
CO2: 23 mmol/L (ref 22–32)
CREATININE: 0.96 mg/dL (ref 0.44–1.00)
Chloride: 105 mmol/L (ref 101–111)
Glucose, Bld: 121 mg/dL — ABNORMAL HIGH (ref 65–99)
Potassium: 3.6 mmol/L (ref 3.5–5.1)
SODIUM: 134 mmol/L — AB (ref 135–145)

## 2016-04-03 LAB — I-STAT TROPONIN, ED: TROPONIN I, POC: 0 ng/mL (ref 0.00–0.08)

## 2016-04-03 LAB — D-DIMER, QUANTITATIVE (NOT AT ARMC): D DIMER QUANT: 0.97 ug{FEU}/mL — AB (ref 0.00–0.50)

## 2016-04-03 MED ORDER — IOPAMIDOL (ISOVUE-370) INJECTION 76%
INTRAVENOUS | Status: AC
  Administered 2016-04-03: 100 mL
  Filled 2016-04-03: qty 100

## 2016-04-03 NOTE — Discharge Instructions (Signed)
You were seen in the emergency room today for evaluation of chest pain and shortness of breath. Your workup was negative. Please follow up with your primary care provider as soon as possible. Please also follow up with cardiology. I have given you the contact information for the [facility] cardiology group. Return to the emergency room for new or worsening symptoms.   Nonspecific Chest Pain  Chest pain can be caused by many different conditions. There is always a chance that your pain could be related to something serious, such as a heart attack or a blood clot in your lungs. Chest pain can also be caused by conditions that are not life-threatening. If you have chest pain, it is very important to follow up with your health care provider. CAUSES  Chest pain can be caused by:  Heartburn.  Pneumonia or bronchitis.  Anxiety or stress.  Inflammation around your heart (pericarditis) or lung (pleuritis or pleurisy).  A blood clot in your lung.  A collapsed lung (pneumothorax). It can develop suddenly on its own (spontaneous pneumothorax) or from trauma to the chest.  Shingles infection (varicella-zoster virus).  Heart attack.  Damage to the bones, muscles, and cartilage that make up your chest wall. This can include:  Bruised bones due to injury.  Strained muscles or cartilage due to frequent or repeated coughing or overwork.  Fracture to one or more ribs.  Sore cartilage due to inflammation (costochondritis). RISK FACTORS  Risk factors for chest pain may include:  Activities that increase your risk for trauma or injury to your chest.  Respiratory infections or conditions that cause frequent coughing.  Medical conditions or overeating that can cause heartburn.  Heart disease or family history of heart disease.  Conditions or health behaviors that increase your risk of developing a blood clot.  Having had chicken pox (varicella zoster). SIGNS AND SYMPTOMS Chest pain can feel  like:  Burning or tingling on the surface of your chest or deep in your chest.  Crushing, pressure, aching, or squeezing pain.  Dull or sharp pain that is worse when you move, cough, or take a deep breath.  Pain that is also felt in your back, neck, shoulder, or arm, or pain that spreads to any of these areas. Your chest pain may come and go, or it may stay constant. DIAGNOSIS Lab tests or other studies may be needed to find the cause of your pain. Your health care provider may have you take a test called an ambulatory ECG (electrocardiogram). An ECG records your heartbeat patterns at the time the test is performed. You may also have other tests, such as:  Transthoracic echocardiogram (TTE). During echocardiography, sound waves are used to create a picture of all of the heart structures and to look at how blood flows through your heart.  Transesophageal echocardiogram (TEE).This is a more advanced imaging test that obtains images from inside your body. It allows your health care provider to see your heart in finer detail.  Cardiac monitoring. This allows your health care provider to monitor your heart rate and rhythm in real time.  Holter monitor. This is a portable device that records your heartbeat and can help to diagnose abnormal heartbeats. It allows your health care provider to track your heart activity for several days, if needed.  Stress tests. These can be done through exercise or by taking medicine that makes your heart beat more quickly.  Blood tests.  Imaging tests. TREATMENT  Your treatment depends on what is causing  your chest pain. Treatment may include:  Medicines. These may include:  Acid blockers for heartburn.  Anti-inflammatory medicine.  Pain medicine for inflammatory conditions.  Antibiotic medicine, if an infection is present.  Medicines to dissolve blood clots.  Medicines to treat coronary artery disease.  Supportive care for conditions that do not  require medicines. This may include:  Resting.  Applying heat or cold packs to injured areas.  Limiting activities until pain decreases. HOME CARE INSTRUCTIONS  If you were prescribed an antibiotic medicine, finish it all even if you start to feel better.  Avoid any activities that bring on chest pain.  Do not use any tobacco products, including cigarettes, chewing tobacco, or electronic cigarettes. If you need help quitting, ask your health care provider.  Do not drink alcohol.  Take medicines only as directed by your health care provider.  Keep all follow-up visits as directed by your health care provider. This is important. This includes any further testing if your chest pain does not go away.  If heartburn is the cause for your chest pain, you may be told to keep your head raised (elevated) while sleeping. This reduces the chance that acid will go from your stomach into your esophagus.  Make lifestyle changes as directed by your health care provider. These may include:  Getting regular exercise. Ask your health care provider to suggest some activities that are safe for you.  Eating a heart-healthy diet. A registered dietitian can help you to learn healthy eating options.  Maintaining a healthy weight.  Managing diabetes, if necessary.  Reducing stress. SEEK MEDICAL CARE IF:  Your chest pain does not go away after treatment.  You have a rash with blisters on your chest.  You have a fever. SEEK IMMEDIATE MEDICAL CARE IF:   Your chest pain is worse.  You have an increasing cough, or you cough up blood.  You have severe abdominal pain.  You have severe weakness.  You faint.  You have chills.  You have sudden, unexplained chest discomfort.  You have sudden, unexplained discomfort in your arms, back, neck, or jaw.  You have shortness of breath at any time.  You suddenly start to sweat, or your skin gets clammy.  You feel nauseous or you vomit.  You  suddenly feel light-headed or dizzy.  Your heart begins to beat quickly, or it feels like it is skipping beats. These symptoms may represent a serious problem that is an emergency. Do not wait to see if the symptoms will go away. Get medical help right away. Call your local emergency services (911 in the [place] Do not drive yourself to the hospital.   This information is not intended to replace advice given to you by your health care provider. Make sure you discuss any questions you have with your health care provider.   Document Released: 07/05/2005 Document Revised: 10/16/2014 Document Reviewed: 05/01/2014 Elsevier Interactive Patient Education [employer].

## 2016-04-03 NOTE — ED Notes (Signed)
Went to bed with SOB, and woke up with more SOB, tingling in L arm & leg, and CP on L side that radiates to R. Has had multiple cases of SOB, CP over the past week or more. Has not sought evaluation until now. Denies weakness, syncope, N/V/D. Recently stopped smoking cigarettes. Also recently began a new oral contraceptive medication, approx 80mo ago.

## 2016-04-03 NOTE — ED Provider Notes (Signed)
CSN: [id number]     Arrival date & time 04/03/16  0553 History   First MD Initiated Contact with Patient 04/03/16 [phone]     Chief Complaint  Patient presents with  . Chest Pain  . Shortness of Breath    HPI   Leslie Cook is an 30 y.o. female with no significant PMH who presents to the ED for evaluation of chest pain and shortness of breath. She states she has had these symptoms intermittently for "a long time" but noticed increased frequency of episodes over the past week. She states the most recent episode occurred last night when she went to bed and felt short of breath with some generalized chest pressure and tingling in her left arm and leg. She states the symptoms resolved by the time she woke up this morning. She states she noticed over the past week these episodes have increased in frequency, occurring almost daily. She states the episodes usually last a few minutes to half an hour and resolve on their own or with sleep. The pressure feels worse laying down and better sitting up. She denies feeling faint or dizzy. Denies abdominal pain, n/v/d. Denies leg pain or swelling. She does endorse taking OCP. She states she quit smoking cigarettes about one month ago. She does admit to using marijuana 3-4 times a week; denies other drug use. Denies significant family cardiac history.   Past Medical History  Diagnosis Date  . Meningitis 2009   Past Surgical History  Procedure Laterality Date  . No past surgeries     Family History  Problem Relation Age of Onset  . Hypertension Mother   . Hypertension Father   . Diabetes Father   . Diabetes Maternal Grandmother    Social History  Substance Use Topics  . Smoking status: Former Smoker    Quit date: 10/07/2012  . Smokeless tobacco: Not on file  . Alcohol Use: No   OB History    Gravida Para Term Preterm AB TAB SAB Ectopic Multiple Living       Review of Systems  All other systems reviewed and are  negative.     Allergies  Review of patient's allergies indicates no known allergies.  Home Medications   Prior to Admission medications   Medication Sig Start Date End Date Taking? Authorizing Provider  norethindrone (MICRONOR,CAMILA,ERRIN) 0.35 MG tablet Take 1 tablet (0.35 mg total) by mouth daily. 09/08/13   Brock Bad, MD  Norgestimate-Ethinyl Estradiol Triphasic (TRI-LO-ESTARYLLA) 0.18/0.215/0.25 MG-25 MCG tab Take 1 tablet by mouth daily. 06/28/15   Brock Bad, MD  Prenatal Vit-Fe Fumarate-FA (PRENATAL MULTIVITAMIN) TABS Take 1 tablet by mouth daily at 12 noon.    Historical Provider, MD   There were no vitals taken for this visit. Physical Exam  Constitutional: She is oriented to person, place, and time.  HENT:  Right Ear: External ear normal.  Left Ear: External ear normal.  Nose: Nose normal.  Mouth/Throat: Oropharynx is clear and moist. No oropharyngeal exudate.  Eyes: Conjunctivae and EOM are normal.  Neck: Normal range of motion. Neck supple.  Cardiovascular: Normal rate, regular rhythm and intact distal pulses.   Murmur heard. Pulmonary/Chest: Effort normal and breath sounds normal. No respiratory distress. She has no wheezes. She has no rales.  Lungs CTAB. No tachypnea.  Abdominal: Soft. Bowel sounds are normal. She exhibits no distension. There is no tenderness. There is no rebound and no guarding.  Musculoskeletal:  She exhibits no edema.  No LE edema  No calf tenderness  Neurological: She is alert and oriented to person, place, and time. No cranial nerve deficit.  Skin: Skin is warm and dry.  Psychiatric: She has a normal mood and affect.  Nursing note and vitals reviewed.   ED Course  Procedures (including critical care time) Labs Review Labs Reviewed  BASIC METABOLIC PANEL - Abnormal; Notable for the following:    Sodium 134 (*)    Glucose, Bld 121 (*)    All other components within normal limits  D-DIMER, QUANTITATIVE (NOT AT [facility]) -  Abnormal; Notable for the following:    D-Dimer, Quant 0.97 (*)    All other components within normal limits  CBC  I-STAT TROPOININ, ED    Imaging Review Dg Chest 2 View  04/03/2016  CLINICAL DATA:  Chest pain and dyspnea, onset today EXAM: CHEST  2 VIEW COMPARISON:  None. FINDINGS: The lungs are clear. The pulmonary vasculature is normal. Heart size is normal. Hilar and mediastinal contours are unremarkable. There is no pleural effusion. IMPRESSION: No active cardiopulmonary disease. Electronically Signed   By: Ellery Plunkaniel R Mitchell M.D.   On: 04/03/2016 06:41   Ct Angio Chest Pe W/cm &/or Wo Cm  04/03/2016  CLINICAL DATA:  Shortness of breath and chest pain EXAM: CT ANGIOGRAPHY CHEST WITH CONTRAST TECHNIQUE: Multidetector CT imaging of the chest was performed using the standard protocol during bolus administration of intravenous contrast. Multiplanar CT image reconstructions and MIPs were obtained to evaluate the vascular anatomy. CONTRAST:  65 mL Isovue 370 nonionic COMPARISON:  Chest radiograph April 03, 2016 FINDINGS: Cardiovascular: There is no demonstrable pulmonary embolus. There is no thoracic aortic aneurysm or dissection. Visualized great vessels appear unremarkable. The right and left common carotid arteries arise as a common trunk, an anatomic variant. There is no appreciable pericardial thickening. Mediastinum: Thyroid appears unremarkable. There is no demonstrable thoracic adenopathy. Lungs/Pleura: There is no parenchymal lung edema or consolidation. No appreciable pleural effusion. Upper Abdomen: A small amount of reflux is noted into the inferior vena cava and hepatic veins. Visualized upper abdominal structures otherwise appear unremarkable. Musculoskeletal: There are no blastic or lytic bone lesions. Review of the MIP images confirms the above findings. IMPRESSION: There is no demonstrable pulmonary embolus. No edema or consolidation. No adenopathy. Slight reflux of contrast into the  inferior vena cava and hepatic veins potentially could indicate a degree of increased right heart pressure. Electronically Signed   By: Bretta BangWilliam  Woodruff III M.D.   On: 04/03/2016 08:50   I have personally reviewed and evaluated these images and lab results as part of my medical decision-making.   EKG Interpretation   Date/Time:  Monday April 03 2016 06:03:29 EDT Ventricular Rate:  75 PR Interval:    QRS Duration: 88 QT Interval:  383 QTC Calculation: 428 R Axis:   90 Text Interpretation:  Sinus rhythm Borderline right axis deviation Low  voltage, precordial leads Borderline T abnormalities, anterior leads No  old tracing to compare Confirmed by CAMPOS  MD, KEVIN ([id number]) on 04/03/2016  6:40:20 AM      MDM   Final diagnoses:  Chest pain, unspecified chest pain type  Shortness of breath    EKG nonacute, CXR negative. Troponin negative. HEART score 1. Doubt ACS. Pt is symptom free at this time. Could not r/o PE based on PERC criteria (OCP use) but low suspicion for PE with a Wells score of 0 so d-dimer ordered. Unfortunately d-dimer is elevated  to 0.97. Discussed with pt. Will obtain CT angio PE study. CBC and BMP at baseline.  CT angio negative for PE. There is slight reflux of contrast into the IVC and hepatic veins potentially indicative of increased right heart pressure. Pt remains symptom free in the ED. Will d/c home with instructions for cardiology and pcp f/u. ER return precautions given.  Carlene CoriaSerena Y Charity Tessier, PA-C 04/03/16 1119  Azalia BilisKevin Campos, MD 04/04/16 [phone]

## 2017-10-09 NOTE — L&D Delivery Note (Addendum)
Delivery Note Progressed very rapidly to complete dilation and pushed involuntarily to delivery  At 8:10 AM a viable and healthy female was delivered via  (Presentation: LOA ).  APGAR: 8, 9; weight  .   Placenta status: spontaneous and grossly intact with 3 vessel Cord:  with the following complications: nuchal cord x 1 loose  Anesthesia:  epidural Episiotomy:  none Lacerations:  none Suture Repair: none Est. Blood Loss (mL):  366  Mom to postpartum.  Baby to Couplet care / Skin to Skin.  Leslie BourgeoisMarie Natasha Cook 06/26/2018, 8:23 AM  Please schedule this patient for Postpartum visit in: 4 weeks with the following provider: Any provider For C/S patients schedule nurse incision check in weeks 2 weeks: no High risk pregnancy complicated by: GDM Delivery mode:  SVD Anticipated Birth Control:  Nexplanon PP Procedures needed: 2 hour GTT  Schedule Integrated BH visit: no

## 2017-11-27 ENCOUNTER — Ambulatory Visit (INDEPENDENT_AMBULATORY_CARE_PROVIDER_SITE_OTHER): Payer: BC Managed Care – PPO | Admitting: Pediatrics

## 2017-11-27 DIAGNOSIS — Z3201 Encounter for pregnancy test, result positive: Secondary | ICD-10-CM

## 2017-11-27 LAB — POCT URINE PREGNANCY: Preg Test, Ur: POSITIVE — AB

## 2017-11-27 MED ORDER — VITAFOL-NANO 18-0.6-0.4 MG PO TABS: 1.0000 | ORAL_TABLET | Freq: Every day | ORAL | 11 refills | Status: AC

## 2017-11-27 NOTE — Progress Notes (Signed)
Pregnancy confirmed. LMP: 09/11/2017.  Rx for PNV sent to pharmacy, coupon given.

## 2017-12-03 ENCOUNTER — Ambulatory Visit (INDEPENDENT_AMBULATORY_CARE_PROVIDER_SITE_OTHER): Payer: BC Managed Care – PPO | Admitting: Certified Nurse Midwife

## 2017-12-03 ENCOUNTER — Encounter: Payer: Self-pay | Admitting: Certified Nurse Midwife

## 2017-12-03 VITALS — BP 126/78 | HR 87 | Wt 184.6 lb

## 2017-12-03 DIAGNOSIS — O99321 Drug use complicating pregnancy, first trimester: Secondary | ICD-10-CM

## 2017-12-03 DIAGNOSIS — Z3481 Encounter for supervision of other normal pregnancy, first trimester: Secondary | ICD-10-CM

## 2017-12-03 DIAGNOSIS — O219 Vomiting of pregnancy, unspecified: Secondary | ICD-10-CM

## 2017-12-03 DIAGNOSIS — Z3687 Encounter for antenatal screening for uncertain dates: Secondary | ICD-10-CM

## 2017-12-03 DIAGNOSIS — O0990 Supervision of high risk pregnancy, unspecified, unspecified trimester: Secondary | ICD-10-CM | POA: Insufficient documentation

## 2017-12-03 DIAGNOSIS — Z8759 Personal history of other complications of pregnancy, childbirth and the puerperium: Secondary | ICD-10-CM

## 2017-12-03 DIAGNOSIS — Z3490 Encounter for supervision of normal pregnancy, unspecified, unspecified trimester: Secondary | ICD-10-CM

## 2017-12-03 MED ORDER — DOXYLAMINE-PYRIDOXINE 10-10 MG PO TBEC: DELAYED_RELEASE_TABLET | ORAL | 4 refills | Status: DC

## 2017-12-03 NOTE — Progress Notes (Signed)
Subjective:   Ned Clinesolisha Chavis is a 32 y.o. G2P0101 at 3246w2d by LMP being seen today for her first obstetrical visit.  Her obstetrical history is significant for obesity and Preterm delivery at 36 weeks. Patient does intend to breast feed. Pregnancy history fully reviewed.  Reports drinking for her birthday at the start of the month and [facility] along with tobacco smoking: stopped when she found out she was pregnant.  Also, [facility] around the start of January but did not realize that she was pregnant and finished 3 weeks of pills.  Reports Bleeding for 16 days at start of December.  Denies any alcohol, tobacco, THC since after her birthday.  Works as a Systems developerspecial ED teacher.   Patient reports headache, nausea, no bleeding, no contractions, no cramping and no leaking.  HISTORY: Obstetric History   G2   P1   T0   P1   A0   L1    SAB0   TAB0   Ectopic0   Multiple0   Live Births1     # Outcome Date GA Lbr Len/2nd Weight Sex Delivery Anes PTL Lv  2 Current           1 Preterm 08/19/13 243w1d 21:39 / 00:07 5 lb 15.8 oz (2.716 kg) M Vag-Spont None  LIV     Name: Wilmon PaliMCALLISTER,BOY Chriselda     Apgar1:  9                Apgar5: 9      Last pap smear was done 03/30/14 and was normal  Past Medical History:  Diagnosis Date  . Meningitis 2009   Past Surgical History:  Procedure Laterality Date  . NO PAST SURGERIES     Family History  Problem Relation Age of Onset  . Hypertension Mother   . Hypertension Father   . Diabetes Father   . Throat cancer Father   . Diabetes Maternal Grandmother    Social History   Tobacco Use  . Smoking status: Former Smoker    Types: Cigarettes    Last attempt to quit: 11/11/2017    Years since quitting: 0.0  . Smokeless tobacco: Never Used  Substance Use Topics  . Alcohol use: No  . Drug use: Yes    Types: Marijuana    Comment: last smoked 11/21/17   No Known Allergies Current Outpatient Medications on File Prior to Visit  Medication  Sig Dispense Refill  . Prenatal-Fe Fum-Methf-FA w/o A (VITAFOL-NANO) 18-0.6-0.4 MG TABS Take 1 tablet by mouth daily. 30 tablet 11   No current facility-administered medications on file prior to visit.     Review of Systems Pertinent items noted in HPI and remainder of comprehensive ROS otherwise negative.  Exam   Vitals:   12/03/17 1339  BP: 126/78  Pulse: 87  Weight: 184 lb 9.6 oz (83.7 kg)   Fetal Heart Rate (bpm): 162  Uterus:     Pelvic Exam: Perineum: no hemorrhoids, normal perineum   Vulva: normal external genitalia, no lesions   Vagina:  normal mucosa, normal discharge   Cervix: no lesions and normal, pap smear done.    Adnexa: normal adnexa and no mass, fullness, tenderness   Bony Pelvis: average  System: General: well-developed, well-nourished female in no acute distress   Breast:  normal appearance, no masses or tenderness   Skin: normal coloration and turgor, no rashes   Neurologic: oriented, normal, negative, normal mood   Extremities: normal  strength, tone, and muscle mass, ROM of all joints is normal   HEENT PERRLA, extraocular movement intact and sclera clear, anicteric   Mouth/Teeth mucous membranes moist, pharynx normal without lesions and dental hygiene good   Neck supple and no masses   Cardiovascular: regular rate and rhythm   Respiratory:  no respiratory distress, normal breath sounds   Abdomen: soft, non-tender; bowel sounds normal; no masses,  no organomegaly     Assessment:   Pregnancy: G2P0101 Patient Active Problem List   Diagnosis Date Noted  . Encounter for supervision of normal pregnancy, unspecified, unspecified trimester 12/03/2017  . 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 History of premature rupture of membranes 08/18/2013     Plan:  1. Encounter for supervision of normal pregnancy, antepartum, unspecified gravidity     - Cytology - PAP - Cervicovaginal ancillary only - Culture, OB Urine - Hemoglobinopathy  evaluation - Obstetric Panel, Including HIV - VITAMIN D 25 Hydroxy (Vit-D Deficiency, Fractures) - Genetic Screening - Cystic Fibrosis Mutation 97 - [place] MFM OB DETAIL +14 WK; Future - Hemoglobin A1c  2. History of premature rupture of membranes     - [place] MFM OB DETAIL +14 WK; Future - US OB Comp Less 14 Wks; Future - US OB Transvaginal; Future  3. Unsure of LMP (last menstrual period) as reason for ultrasound scan    - US OB Comp Less 14 Wks; Future  4. 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  OCP use in January.   5. Nausea and vomiting in pregnancy      - Doxylamine-Pyridoxine (DICLEGIS) 10-10 MG TBEC; Take 1 tablet with breakfast and lunch.  Take 2 tablets at bedtime.  Dispense: 100 tablet; Refill: 4   Initial labs drawn. Continue prenatal vitamins. Genetic Screening discussed, NIPS: ordered. Ultrasound discussed; fetal anatomic survey: ordered. Problem list reviewed and updated. The nature of [facility] with multiple MDs and other Advanced Practice Providers was explained to patient; also emphasized that residents, students are part of our team. Routine obstetric precautions reviewed. Return in about 4 weeks (around 12/31/2017) for [facility], Needs to see FP MD here.     Orvilla Cornwall, CNM [facility] for [employer], [facility]

## 2017-12-04 LAB — CERVICOVAGINAL ANCILLARY ONLY
BACTERIAL VAGINITIS: NEGATIVE
CANDIDA VAGINITIS: NEGATIVE
CHLAMYDIA, DNA PROBE: NEGATIVE
NEISSERIA GONORRHEA: NEGATIVE
Trichomonas: NEGATIVE

## 2017-12-05 LAB — CYTOLOGY - PAP
DIAGNOSIS: NEGATIVE
HPV (WINDOPATH): NOT DETECTED

## 2017-12-05 LAB — CULTURE, OB URINE

## 2017-12-05 LAB — URINE CULTURE, OB REFLEX

## 2017-12-06 ENCOUNTER — Other Ambulatory Visit: Payer: Self-pay | Admitting: Certified Nurse Midwife

## 2017-12-06 ENCOUNTER — Ambulatory Visit (HOSPITAL_COMMUNITY)
Admission: RE | Admit: 2017-12-06 | Discharge: 2017-12-06 | Disposition: A | Discharge status: Home / Self Care | Payer: BC Managed Care – PPO | Source: Ambulatory Visit | Attending: Certified Nurse Midwife | Admitting: Certified Nurse Midwife

## 2017-12-06 DIAGNOSIS — Z3A11 11 weeks gestation of pregnancy: Secondary | ICD-10-CM | POA: Insufficient documentation

## 2017-12-06 DIAGNOSIS — O3110X0 Continuing pregnancy after spontaneous abortion of one fetus or more, unspecified trimester, not applicable or unspecified: Secondary | ICD-10-CM | POA: Insufficient documentation

## 2017-12-06 DIAGNOSIS — Z3491 Encounter for supervision of normal pregnancy, unspecified, first trimester: Secondary | ICD-10-CM | POA: Insufficient documentation

## 2017-12-06 DIAGNOSIS — Z3687 Encounter for antenatal screening for uncertain dates: Secondary | ICD-10-CM

## 2017-12-06 DIAGNOSIS — Z8759 Personal history of other complications of pregnancy, childbirth and the puerperium: Secondary | ICD-10-CM

## 2017-12-06 DIAGNOSIS — Z3480 Encounter for supervision of other normal pregnancy, unspecified trimester: Secondary | ICD-10-CM

## 2017-12-07 ENCOUNTER — Telehealth: Payer: Self-pay

## 2017-12-07 NOTE — Telephone Encounter (Signed)
Patient wanted to know EDD based on her US results.

## 2017-12-08 LAB — HEMOGLOBINOPATHY EVALUATION
HEMOGLOBIN A2 QUANTITATION: 2.4 % (ref 1.8–3.2)
HEMOGLOBIN F QUANTITATION: 0 % (ref 0.0–2.0)
HGB C: 0 %
HGB S: 0 %
HGB VARIANT: 0 %
Hgb A: 97.6 % (ref 96.4–98.8)

## 2017-12-08 LAB — OBSTETRIC PANEL, INCLUDING HIV
Antibody Screen: NEGATIVE
BASOS ABS: 0 10*3/uL (ref 0.0–0.2)
Basos: 0 %
EOS (ABSOLUTE): 0.1 10*3/uL (ref 0.0–0.4)
Eos: 1 %
HIV SCREEN 4TH GENERATION: NONREACTIVE
Hematocrit: 36.7 % (ref 34.0–46.6)
Hemoglobin: 12.4 g/dL (ref 11.1–15.9)
Hepatitis B Surface Ag: NEGATIVE
Immature Grans (Abs): 0 10*3/uL (ref 0.0–0.1)
Immature Granulocytes: 0 %
LYMPHS ABS: 2.2 10*3/uL (ref 0.7–3.1)
Lymphs: 18 %
MCH: 29.7 pg (ref 26.6–33.0)
MCHC: 33.8 g/dL (ref 31.5–35.7)
MCV: 88 fL (ref 79–97)
MONOS ABS: 0.9 10*3/uL (ref 0.1–0.9)
Monocytes: 8 %
NEUTROS ABS: 8.8 10*3/uL — AB (ref 1.4–7.0)
NEUTROS PCT: 73 %
PLATELETS: 367 10*3/uL (ref 150–379)
RBC: 4.17 x10E6/uL (ref 3.77–5.28)
RDW: 13.1 % (ref 12.3–15.4)
RPR Ser Ql: NONREACTIVE
Rh Factor: POSITIVE
Rubella Antibodies, IGG: 2.58 index (ref >0.99)
WBC: 12 10*3/uL — ABNORMAL HIGH (ref 3.4–10.8)

## 2017-12-08 LAB — HEMOGLOBIN A1C
Est. average glucose Bld gHb Est-mCnc: 105 mg/dL
Hgb A1c MFr Bld: 5.3 % (ref 4.8–5.6)

## 2017-12-08 LAB — CYSTIC FIBROSIS MUTATION 97: GENE DIS ANAL CARRIER INTERP BLD/T-IMP: NOT DETECTED

## 2017-12-08 LAB — VITAMIN D 25 HYDROXY (VIT D DEFICIENCY, FRACTURES): VIT D 25 HYDROXY: 21.7 ng/mL — AB (ref 30.0–100.0)

## 2017-12-10 ENCOUNTER — Other Ambulatory Visit: Payer: Self-pay | Admitting: Certified Nurse Midwife

## 2017-12-10 DIAGNOSIS — Z3480 Encounter for supervision of other normal pregnancy, unspecified trimester: Secondary | ICD-10-CM

## 2017-12-10 DIAGNOSIS — E559 Vitamin D deficiency, unspecified: Secondary | ICD-10-CM

## 2017-12-10 MED ORDER — VITAMIN D (ERGOCALCIFEROL) 1.25 MG (50000 UNIT) PO CAPS: 50000.0000 [IU] | ORAL_CAPSULE | ORAL | 2 refills | Status: DC

## 2017-12-11 ENCOUNTER — Encounter: Payer: Self-pay | Admitting: Obstetrics and Gynecology

## 2017-12-11 DIAGNOSIS — O09899 Supervision of other high risk pregnancies, unspecified trimester: Secondary | ICD-10-CM | POA: Insufficient documentation

## 2017-12-11 DIAGNOSIS — O09219 Supervision of pregnancy with history of pre-term labor, unspecified trimester: Secondary | ICD-10-CM

## 2017-12-24 ENCOUNTER — Other Ambulatory Visit: Payer: Self-pay | Admitting: Certified Nurse Midwife

## 2017-12-25 ENCOUNTER — Other Ambulatory Visit: Payer: Self-pay | Admitting: Certified Nurse Midwife

## 2017-12-25 DIAGNOSIS — O3110X0 Continuing pregnancy after spontaneous abortion of one fetus or more, unspecified trimester, not applicable or unspecified: Secondary | ICD-10-CM

## 2017-12-31 ENCOUNTER — Ambulatory Visit (INDEPENDENT_AMBULATORY_CARE_PROVIDER_SITE_OTHER): Payer: BC Managed Care – PPO | Admitting: Obstetrics and Gynecology

## 2017-12-31 VITALS — BP 127/80 | HR 85 | Wt 183.2 lb

## 2017-12-31 DIAGNOSIS — O09219 Supervision of pregnancy with history of pre-term labor, unspecified trimester: Secondary | ICD-10-CM

## 2017-12-31 DIAGNOSIS — Z3480 Encounter for supervision of other normal pregnancy, unspecified trimester: Secondary | ICD-10-CM

## 2017-12-31 DIAGNOSIS — O09899 Supervision of other high risk pregnancies, unspecified trimester: Secondary | ICD-10-CM

## 2017-12-31 DIAGNOSIS — O09212 Supervision of pregnancy with history of pre-term labor, second trimester: Secondary | ICD-10-CM

## 2017-12-31 DIAGNOSIS — Z3482 Encounter for supervision of other normal pregnancy, second trimester: Secondary | ICD-10-CM

## 2017-12-31 MED ORDER — HYDROXYPROGESTERONE CAPROATE 250 MG/ML IM OIL
250.0000 mg | TOPICAL_OIL | Freq: Once | INTRAMUSCULAR | Status: AC
  Administered 2017-12-31: 250 mg via INTRAMUSCULAR

## 2017-12-31 NOTE — Progress Notes (Signed)
   PRENATAL VISIT NOTE  Subjective:  Leslie Cook is a 32 y.o. G2P0101 at 7257w2d being seen today for ongoing prenatal care.  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  Patient reports no complaints.  Contractions: Not present. Vag. Bleeding: None.   . Denies leaking of fluid.   The following portions of the patient's history were reviewed and updated as appropriate: allergies, current medications, past family history, past medical history, past social history, past surgical history and problem list. Problem list updated.  Objective:   Vitals:   12/31/17 1324  BP: 127/80  Pulse: 85  Weight: 183 lb 3.2 oz (83.1 kg)    Fetal Status: Fetal Heart Rate (bpm): 158         General:  Alert, oriented and cooperative. Patient is in no acute distress.  Skin: Skin is warm and dry. No rash noted.   Cardiovascular: Normal heart rate noted  Respiratory: Normal respiratory effort, no problems with respiration noted  Abdomen: Soft, gravid, appropriate for gestational age.  Pain/Pressure: Absent     Pelvic: Cervical exam deferred        Extremities: Normal range of motion.  Edema: None  Mental Status:  Normal mood and affect. Normal behavior. Normal judgment and thought content.   Assessment and Plan:  Pregnancy: G2P0101 at 6957w2d  1. Supervision of other normal pregnancy, antepartum Patient is doing well without complaints Explained abnormal NIPS results due to vanishing twin. Plan is to follow up as scheduled for anatomy ultrasound and discuss further genetic testing options at that time  2. History of preterm delivery, currently pregnant Patient to start weekly 17-P today  Preterm labor symptoms  and general obstetric precautions including but not limited to vaginal bleeding, contractions, leaking of fluid and fetal movement were reviewed in detail with the patient. Please refer to After Visit Summary for other counseling recommendations.  Return in about 1 month (around 01/28/2018) for ROB, weekly for 17-P.   Catalina AntiguaPeggy Blakeley Scheier, MD

## 2018-01-01 ENCOUNTER — Telehealth: Payer: Self-pay

## 2018-01-01 NOTE — Telephone Encounter (Signed)
Patient returned call, she [facility].  [facility] not [facility] for [employer].

## 2018-01-01 NOTE — Telephone Encounter (Signed)
Left VM message to call office.  Need to verify which Makena method patient wants IM or Auto Injector? Please note that patient has [employer] copyrivate Insurance and [facility] do not fill, as they can only bill Medicaid per Pharmacist Toniann Fail(Wendy).

## 2018-01-03 ENCOUNTER — Telehealth: Payer: Self-pay

## 2018-01-03 NOTE — Telephone Encounter (Signed)
Advised pt that pharmacy called and stated that IM injections are not available, pt agreed to auto injectors.

## 2018-01-07 ENCOUNTER — Ambulatory Visit (INDEPENDENT_AMBULATORY_CARE_PROVIDER_SITE_OTHER): Payer: BC Managed Care – PPO | Admitting: *Deleted

## 2018-01-07 VITALS — BP 134/79 | HR 90 | Wt 186.0 lb

## 2018-01-07 DIAGNOSIS — O09212 Supervision of pregnancy with history of pre-term labor, second trimester: Secondary | ICD-10-CM

## 2018-01-07 DIAGNOSIS — O09219 Supervision of pregnancy with history of pre-term labor, unspecified trimester: Principal | ICD-10-CM

## 2018-01-07 DIAGNOSIS — O09899 Supervision of other high risk pregnancies, unspecified trimester: Secondary | ICD-10-CM

## 2018-01-07 MED ORDER — HYDROXYPROGESTERONE CAPROATE 250 MG/ML IM OIL
250.0000 mg | TOPICAL_OIL | Freq: Once | INTRAMUSCULAR | Status: AC
  Administered 2018-01-07: 250 mg via INTRAMUSCULAR

## 2018-01-07 NOTE — Progress Notes (Signed)
Pt is in office for 17p injection.  Pt was given IM injection- tolerated well.  Pt has no other concerns today.  BP 134/79   Pulse 90   Wt 186 lb (84.4 kg)   LMP 09/08/2017   BMI 34.02 kg/m   Administrations This Visit    hydroxyprogesterone caproate (MAKENA) 250 mg/mL injection 250 mg    Admin Date 01/07/2018 Action Given Dose 250 mg Route Intramuscular Administered By Lanney GinsFoster, Arsh Feutz D, CMA

## 2018-01-08 NOTE — Progress Notes (Signed)
I have reviewed the chart and agree with nursing staff's documentation of this patient's encounter.  Catalina AntiguaPeggy Sereniti Wan, MD 01/08/2018 8:34 AM

## 2018-01-14 ENCOUNTER — Ambulatory Visit: Payer: BC Managed Care – PPO

## 2018-01-14 NOTE — Progress Notes (Unsigned)
Patient Presents for 17 P Injection only today.

## 2018-01-21 ENCOUNTER — Ambulatory Visit (HOSPITAL_COMMUNITY)
Admission: RE | Admit: 2018-01-21 | Discharge: 2018-01-21 | Disposition: A | Discharge status: Home / Self Care | Payer: BC Managed Care – PPO | Source: Ambulatory Visit | Attending: Certified Nurse Midwife | Admitting: Certified Nurse Midwife

## 2018-01-21 ENCOUNTER — Other Ambulatory Visit: Payer: Self-pay | Admitting: Certified Nurse Midwife

## 2018-01-21 ENCOUNTER — Ambulatory Visit (INDEPENDENT_AMBULATORY_CARE_PROVIDER_SITE_OTHER): Payer: BC Managed Care – PPO

## 2018-01-21 ENCOUNTER — Encounter (HOSPITAL_COMMUNITY): Payer: Self-pay

## 2018-01-21 DIAGNOSIS — O09219 Supervision of pregnancy with history of pre-term labor, unspecified trimester: Principal | ICD-10-CM

## 2018-01-21 DIAGNOSIS — O09899 Supervision of other high risk pregnancies, unspecified trimester: Secondary | ICD-10-CM

## 2018-01-21 DIAGNOSIS — O281 Abnormal biochemical finding on antenatal screening of mother: Secondary | ICD-10-CM

## 2018-01-21 DIAGNOSIS — Z3A17 17 weeks gestation of pregnancy: Secondary | ICD-10-CM | POA: Diagnosis not present

## 2018-01-21 DIAGNOSIS — Z3480 Encounter for supervision of other normal pregnancy, unspecified trimester: Secondary | ICD-10-CM

## 2018-01-21 DIAGNOSIS — Z3686 Encounter for antenatal screening for cervical length: Secondary | ICD-10-CM

## 2018-01-21 DIAGNOSIS — Z3689 Encounter for other specified antenatal screening: Secondary | ICD-10-CM

## 2018-01-21 DIAGNOSIS — O99212 Obesity complicating pregnancy, second trimester: Secondary | ICD-10-CM | POA: Diagnosis not present

## 2018-01-21 DIAGNOSIS — O09892 Supervision of other high risk pregnancies, second trimester: Secondary | ICD-10-CM

## 2018-01-21 DIAGNOSIS — O09212 Supervision of pregnancy with history of pre-term labor, second trimester: Secondary | ICD-10-CM | POA: Diagnosis not present

## 2018-01-21 DIAGNOSIS — O3110X0 Continuing pregnancy after spontaneous abortion of one fetus or more, unspecified trimester, not applicable or unspecified: Secondary | ICD-10-CM

## 2018-01-21 DIAGNOSIS — Z3490 Encounter for supervision of normal pregnancy, unspecified, unspecified trimester: Secondary | ICD-10-CM

## 2018-01-21 DIAGNOSIS — Z8759 Personal history of other complications of pregnancy, childbirth and the puerperium: Secondary | ICD-10-CM

## 2018-01-21 MED ORDER — HYDROXYPROGESTERONE CAPROATE 250 MG/ML IM OIL
250.0000 mg | TOPICAL_OIL | Freq: Once | INTRAMUSCULAR | Status: AC
  Administered 2018-01-21: 250 mg via INTRAMUSCULAR

## 2018-01-21 NOTE — Progress Notes (Signed)
Pt here for 17p. Inj given in right upper outer quad. Pt tolerated well. Pt states that she contacted her pharmacy, and that her shipment should be here tomorrow for her auto-injectors.

## 2018-01-21 NOTE — Progress Notes (Signed)
I have reviewed the chart and agree with nursing staff's documentation of this patient's encounter.  Leslie Sermeno, MD 01/21/2018 3:32 PM    

## 2018-01-22 ENCOUNTER — Other Ambulatory Visit: Payer: Self-pay

## 2018-01-24 DIAGNOSIS — Z3A17 17 weeks gestation of pregnancy: Secondary | ICD-10-CM | POA: Insufficient documentation

## 2018-01-24 NOTE — Progress Notes (Signed)
Genetic Counseling  High-Risk Gestation Note  Appointment Date:  01/21/2018 Referred By: Roe Coombsenney, Rachelle A, CNM Date of Birth:  05/12/1986 Partner:  Colon FlatteryKarseem Firkus   Pregnancy History: G2P0101 Estimated Date of Delivery: 06/27/18 Estimated Gestational Age: 8336w4d Attending: Eulis FosterKristen Quinn, MD   Mrs. Leslie Cook and her husband, Mr. Edd FabianKareem Sur, were seen for genetic counseling given that Panorama (noninvasive prenatal screening) resulted as high risk for vanishing twin, fetal triploidy, or unrecognized multiple gestation.   In summary:  Discussed AMA and associated risk for fetal aneuploidy  Given patient's age alone, pregnancy considered low risk for fetal aneuploidy  Reviewed results of NIPS (Panorama)- increased risk for triploidy/vanishing twin/unrecognized multiple gestation ? Reviewed methodology of Panorama and that multiple DNA patterns were detected in sample ? Patient's ultrasound on 12/06/17 reported vanishing twin detected ? Given that NIPS was drawn 12/03/17, discussed that vanishing twin is suspected explanation for Panorama result  Discussed options for screening ? Quad screen - declined ? Repeat NIPS given currently later in gestation- patient elected redraw for Panorama today  Ultrasound- performed today  Discussed diagnostic testing options ? Amniocentesis- declined   Mrs. Leslie Brunsolisha Gallaher previously had noninvasive prenatal screening (NIPS)/prenatal cell free DNA testing performed through her OB provider. This screening, specifically [facility] identified high risk for either a vanishing twin, unrecognized multiple gestation, or an increased risk for fetal triploidy. We reviewed the methodology of NIPS, specifically the SNP based platform of Panorama and discussed that this result is reflective of detection of multiple DNA patterns in the sample. Mrs. Costlow's NIPS was drawn on 12/03/17, and ultrasound on 12/06/17 detected vanishing twin. Thus, we  discussed that this Panorama result is expected to be reflective of vanishing twin in the pregnancy. The pregnancy would thus, not be expected to be at increased risk for triploidy or other fetal aneuploidy based on this NIPS result and earlier ultrasound.   Detailed ultrasound was performed today. Evidence of vanishing twin not well visualized today. Complete ultrasound report under separate cover. We reviewed the benefits and limitations of ultrasound as a screening tool for fetal aneuploidy.   We discussed the association with increased maternal age and fetal aneuploidy. Given the patient's age alone, the pregnancy is not considered to be at increased risk for fetal aneuploidy. We reviewed additional available screening options including Quad screen and repeat NIPS. We discussed that data in medical literature is limited regarding the length of time that circulating cell free DNA may be detected in maternal blood stream following a vanishing twin. However, we discussed that with increasing gestational age following the loss of the twin, the odds of an informative NIPS result would be expected to increase. She was counseled that screening tests are used to modify a patient's a priori risk for aneuploidy, typically based on age. This estimate provides a pregnancy specific risk assessment. We reviewed the benefits and limitations of each option. Specifically, we discussed the conditions for which each test screens, the detection rates, and false positive rates of each. She was also counseled regarding diagnostic testing via amniocentesis. We reviewed the approximate 1 in 300-500 risk for complications from amniocentesis, including spontaneous pregnancy loss. We discussed the possible results that the tests might provide including: positive, negative, unanticipated, and no result. They are aware that repeat NIPS could again result in the same information.  Finally, they were counseled regarding the cost of each  option and potential out of pocket expenses. After careful consideration, Mrs. Kristine LineaDent elected to pursue NIPS  today. They declined Quad screen and amniocentesis.   Mrs. Lili Antunes was provided with written information regarding cystic fibrosis (CF), spinal muscular atrophy (SMA) and hemoglobinopathies including the carrier frequency, availability of carrier screening and prenatal diagnosis if indicated.  In addition, we discussed that CF and hemoglobinopathies are routinely screened for as part of the [place] newborn screening panel, and SMA is available on NBS under a research protocol in [place] for families who enroll in Early Check program. CF carrier screening and hemoglobin electrophoresis were previously performed for the patient and were within normal limits. After further discussion, she declined screening for SMA.   Both family histories were briefly reviewed and reported to be noncontributory for birth defects, intellectual disability, and known genetic conditions. Consanguinity was denied for the couple. Without further information regarding the provided family history, an accurate genetic risk cannot be calculated. Further genetic counseling is warranted if more information is obtained.  Mrs. Terrance denied exposure to environmental toxins or chemical agents. She denied the use of alcohol, tobacco or street drugs. She denied significant viral illnesses during the course of her pregnancy.   I counseled this couple regarding the above risks and available options.  The approximate face-to-face time with the genetic counselor was 30 minutes.  Quinn Plowman, MS,  Certified Genetic Counselor 01/24/2018 4:54 PM

## 2018-01-28 ENCOUNTER — Ambulatory Visit (INDEPENDENT_AMBULATORY_CARE_PROVIDER_SITE_OTHER): Payer: BC Managed Care – PPO | Admitting: Obstetrics and Gynecology

## 2018-01-28 ENCOUNTER — Telehealth (HOSPITAL_COMMUNITY): Payer: Self-pay | Admitting: MS"

## 2018-01-28 ENCOUNTER — Encounter: Payer: Self-pay | Admitting: Obstetrics and Gynecology

## 2018-01-28 VITALS — BP 121/81 | HR 90 | Wt 189.2 lb

## 2018-01-28 DIAGNOSIS — O09219 Supervision of pregnancy with history of pre-term labor, unspecified trimester: Secondary | ICD-10-CM

## 2018-01-28 DIAGNOSIS — O09899 Supervision of other high risk pregnancies, unspecified trimester: Secondary | ICD-10-CM

## 2018-01-28 DIAGNOSIS — Z3480 Encounter for supervision of other normal pregnancy, unspecified trimester: Secondary | ICD-10-CM

## 2018-01-28 MED ORDER — HYDROXYPROGESTERONE CAPROATE 275 MG/1.1ML ~~LOC~~ SOAJ
275.0000 mg | Freq: Once | SUBCUTANEOUS | Status: AC
  Administered 2018-01-28: 275 mg via SUBCUTANEOUS

## 2018-01-28 MED ORDER — HYDROXYPROGESTERONE CAPROATE 250 MG/ML IM OIL: 250.0000 mg | TOPICAL_OIL | Freq: Once | INTRAMUSCULAR | Status: DC

## 2018-01-28 NOTE — Telephone Encounter (Signed)
Called Leslie Cook to discuss her prenatal cell free DNA test results.  Mrs. Leslie Cook [facility].  Testing was offered because of uninformative result on the first NIPS draw.   The patient was identified by name and DOB.  We reviewed that these are within normal limits, showing a less than 1 in 10,000 risk for trisomies 21, 18 and 13, and monosomy X (Turner syndrome).  In addition, the risk for triploidy and sex chromosome trisomies (47,XXX and 47,XXY) was also low risk.  We reviewed that this testing identifies > 99% of pregnancies with trisomy 7921, trisomy 4413, sex chromosome trisomies (47,XXX and 47,XXY), and triploidy. The detection rate for trisomy 18 is 96%.  The detection rate for monosomy X is ~92%.  The false positive rate is <0.1% for all conditions. Testing was also consistent with female fetal sex.  The patient did wish to know fetal sex.  She understands that this testing does not identify all genetic conditions.  All questions were answered to her satisfaction, she was encouraged to call with additional questions or concerns.  Quinn PlowmanKaren Annelyse Rey, MS Certified Genetic Counselor 01/28/2018 3:58 PM

## 2018-01-28 NOTE — Progress Notes (Signed)
   PRENATAL VISIT NOTE  Subjective:  Leslie Cook is a 32 y.o. G2P0101 at 6925w4d being seen today for ongoing prenatal care.  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  Patient reports no complaints.  Contractions: Not present. Vag. Bleeding: None.  Movement: Present. Denies leaking of fluid.   The following portions of the patient's history were reviewed and updated as appropriate: allergies, current medications, past family history, past medical history, past social history, past surgical history and problem list. Problem list updated.  Objective:   Vitals:   01/28/18 1602  BP: 121/81  Pulse: 90  Weight: 189 lb 3.2 oz (85.8 kg)    Fetal Status: Fetal Heart Rate (bpm): 156   Movement: Present     General:  Alert, oriented and cooperative. Patient is in no acute distress.  Skin: Skin is warm and dry. No rash noted.   Cardiovascular: Normal heart rate noted  Respiratory: Normal respiratory effort, no problems with respiration noted  Abdomen: Soft, gravid, appropriate for gestational age.  Pain/Pressure: Absent     Pelvic: Cervical exam deferred        Extremities: Normal range of motion.  Edema: None  Mental Status: Normal mood and affect. Normal behavior. Normal judgment and thought content.   Assessment and Plan:  Pregnancy: G2P0101 at 5525w4d  1. Supervision of other normal pregnancy, antepartum Patient is doing well without complaints Anatomy ultrasound report reviewed with the patient AFP today - AFP, Serum, Open Spina Bifida  2. History of preterm delivery, currently pregnant Weekly 17-P  Preterm labor symptoms and general obstetric precautions  including but not limited to vaginal bleeding, contractions, leaking of fluid and fetal movement were reviewed in detail with the patient. Please refer to After Visit Summary for other counseling recommendations.  Return in about 1 month (around 02/25/2018) for ROB.  No future appointments.  Catalina AntiguaPeggy Eddison Searls, MD

## 2018-02-04 ENCOUNTER — Ambulatory Visit (INDEPENDENT_AMBULATORY_CARE_PROVIDER_SITE_OTHER): Payer: BC Managed Care – PPO

## 2018-02-04 DIAGNOSIS — O09219 Supervision of pregnancy with history of pre-term labor, unspecified trimester: Principal | ICD-10-CM

## 2018-02-04 DIAGNOSIS — O09212 Supervision of pregnancy with history of pre-term labor, second trimester: Secondary | ICD-10-CM | POA: Diagnosis not present

## 2018-02-04 DIAGNOSIS — O09899 Supervision of other high risk pregnancies, unspecified trimester: Secondary | ICD-10-CM

## 2018-02-04 MED ORDER — HYDROXYPROGESTERONE CAPROATE 250 MG/ML IM OIL: 250.0000 mg | TOPICAL_OIL | Freq: Once | INTRAMUSCULAR | 5 refills | Status: DC

## 2018-02-04 MED ORDER — HYDROXYPROGESTERONE CAPROATE 250 MG/ML IM OIL: 250.0000 mg | TOPICAL_OIL | Freq: Once | INTRAMUSCULAR | Status: DC

## 2018-02-04 MED ORDER — HYDROXYPROGESTERONE CAPROATE 275 MG/1.1ML ~~LOC~~ SOAJ
275.0000 mg | Freq: Once | SUBCUTANEOUS | Status: AC
  Administered 2018-02-11: 275 mg via SUBCUTANEOUS

## 2018-02-04 NOTE — Progress Notes (Addendum)
Nurse visit for pt supplied 17p given R arm w/o difficulty. Pt requests to switch 17p to IM d/t algia in arm x 1 wk. Pt aware insurance may not cover IM yet since she has SQ left. Received call from "Verlon Au" from compounding pharmacy states they are not contracted with [employer] and will not be able to fill pt's prescription. Pt is aware and will stick with SQ.

## 2018-02-05 NOTE — Progress Notes (Signed)
I have reviewed the chart and agree with nursing staff's documentation of this patient's encounter.  Ashtin Melichar, MD 02/05/2018 3:40 PM    

## 2018-02-05 NOTE — Addendum Note (Signed)
Addended by: Dalphine Handing on: 02/05/2018 11:55 AM   Modules accepted: Orders

## 2018-02-06 LAB — AFP, SERUM, OPEN SPINA BIFIDA
AFP MOM: 0.91
AFP VALUE AFPOSL: 39.7 ng/mL
Gest. Age on Collection Date: 18.6 weeks
Maternal Age At EDD: 30.6 yr
OSBR RISK 1 IN: 10000
TEST RESULTS AFP: NEGATIVE
Weight: 189 [lb_av]

## 2018-02-11 ENCOUNTER — Ambulatory Visit (INDEPENDENT_AMBULATORY_CARE_PROVIDER_SITE_OTHER): Payer: BC Managed Care – PPO | Admitting: *Deleted

## 2018-02-11 VITALS — BP 119/79 | HR 76 | Wt 186.0 lb

## 2018-02-11 DIAGNOSIS — O09212 Supervision of pregnancy with history of pre-term labor, second trimester: Secondary | ICD-10-CM | POA: Diagnosis not present

## 2018-02-11 DIAGNOSIS — O09899 Supervision of other high risk pregnancies, unspecified trimester: Secondary | ICD-10-CM

## 2018-02-11 DIAGNOSIS — O09219 Supervision of pregnancy with history of pre-term labor, unspecified trimester: Principal | ICD-10-CM

## 2018-02-11 NOTE — Progress Notes (Signed)
Pt is in office for 17p injection.  Pt tolerated injection well. Pt has no other concerns today.   Administrations This Visit    HYDROXYprogesterone Caproate SOAJ 275 mg    Admin Date 02/11/2018 Action Given Dose 275 mg Route Subcutaneous Administered By Lanney Gins, CMA

## 2018-02-12 NOTE — Progress Notes (Signed)
I have reviewed the chart and agree with nursing staff's documentation of this patient's encounter.  Wilmina Maxham A Gwendola Hornaday, CNM 02/12/2018 9:36 AM    

## 2018-02-18 ENCOUNTER — Ambulatory Visit (INDEPENDENT_AMBULATORY_CARE_PROVIDER_SITE_OTHER): Payer: BC Managed Care – PPO

## 2018-02-18 DIAGNOSIS — O09899 Supervision of other high risk pregnancies, unspecified trimester: Secondary | ICD-10-CM

## 2018-02-18 DIAGNOSIS — O09219 Supervision of pregnancy with history of pre-term labor, unspecified trimester: Principal | ICD-10-CM

## 2018-02-18 DIAGNOSIS — O09212 Supervision of pregnancy with history of pre-term labor, second trimester: Secondary | ICD-10-CM | POA: Diagnosis not present

## 2018-02-18 MED ORDER — HYDROXYPROGESTERONE CAPROATE 275 MG/1.1ML ~~LOC~~ SOAJ
275.0000 mg | Freq: Once | SUBCUTANEOUS | Status: AC
  Administered 2018-02-18: 275 mg via SUBCUTANEOUS

## 2018-02-18 NOTE — Progress Notes (Signed)
I have reviewed the chart and agree with nursing staff's documentation of this patient's encounter.  Catalina Antigua, MD 02/18/2018 4:18 PM

## 2018-02-18 NOTE — Progress Notes (Signed)
Nurse visit for pt supplied 17p given R arm w/o difficulty.

## 2018-02-25 ENCOUNTER — Ambulatory Visit (INDEPENDENT_AMBULATORY_CARE_PROVIDER_SITE_OTHER): Payer: BC Managed Care – PPO | Admitting: Obstetrics & Gynecology

## 2018-02-25 ENCOUNTER — Encounter: Payer: Self-pay | Admitting: Obstetrics & Gynecology

## 2018-02-25 VITALS — BP 127/89 | HR 77 | Wt 188.7 lb

## 2018-02-25 DIAGNOSIS — O09899 Supervision of other high risk pregnancies, unspecified trimester: Secondary | ICD-10-CM

## 2018-02-25 DIAGNOSIS — O09219 Supervision of pregnancy with history of pre-term labor, unspecified trimester: Principal | ICD-10-CM

## 2018-02-25 DIAGNOSIS — O09212 Supervision of pregnancy with history of pre-term labor, second trimester: Secondary | ICD-10-CM

## 2018-02-25 DIAGNOSIS — O0992 Supervision of high risk pregnancy, unspecified, second trimester: Secondary | ICD-10-CM

## 2018-02-25 MED ORDER — HYDROXYPROGESTERONE CAPROATE 275 MG/1.1ML ~~LOC~~ SOAJ
275.0000 mg | Freq: Once | SUBCUTANEOUS | Status: AC
  Administered 2018-02-25: 275 mg via SUBCUTANEOUS

## 2018-02-25 NOTE — Progress Notes (Signed)
   PRENATAL VISIT NOTE  Subjective:  Leslie Cook is a 32 y.o. G2P0101 at [redacted]w[redacted]d being seen today for ongoing prenatal care.  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  Patient reports no complaints.  Contractions: Not present. Vag. Bleeding: None.  Movement: Present. Denies leaking of fluid.   The following portions of the patient's history were reviewed and updated as appropriate: allergies, current medications, past family history, past medical history, past social history, past surgical history and problem list. Problem list updated.  Objective:   Vitals:   02/25/18 1614  BP: 127/89  Pulse: 77  Weight: 188 lb 11.2 oz (85.6 kg)    Fetal Status: Fetal Heart Rate (bpm): 150 Fundal Height: 22 cm Movement: Present     General:  Alert, oriented and cooperative. Patient is in no acute distress.  Skin: Skin is warm and dry. No rash noted.   Cardiovascular: Normal heart rate noted  Respiratory: Normal respiratory effort, no problems with respiration noted  Abdomen: Soft, gravid, appropriate for gestational age.  Pain/Pressure: Absent     Pelvic: Cervical exam deferred        Extremities: Normal range of motion.  Edema: None  Mental Status: Normal mood and affect. Normal behavior. Normal judgment and thought content.   Assessment and Plan:  Pregnancy: G2P0101 at [redacted]w[redacted]d  1. History of preterm delivery, currently pregnant Continue weekly 17P, given today. - HYDROXYprogesterone Caproate SOAJ 275 mg  2. Supervision of high risk pregnancy in second trimester Preterm labor symptoms and general obstetric precautions including but not limited to vaginal bleeding, contractions, leaking of fluid and fetal movement were reviewed in detail with the patient. Please  refer to After Visit Summary for other counseling recommendations.  Return for Weekly 17 P visits for 3 weeks then OB visit and 17P in 4 weeks.  Jaynie Collins, MD

## 2018-02-25 NOTE — Patient Instructions (Signed)
Return to clinic for any scheduled appointments or obstetric concerns, or go to [facility]U for evaluation  

## 2018-03-05 ENCOUNTER — Ambulatory Visit: Payer: BC Managed Care – PPO

## 2018-03-05 VITALS — BP 121/68 | HR 83 | Wt 191.4 lb

## 2018-03-05 DIAGNOSIS — O09899 Supervision of other high risk pregnancies, unspecified trimester: Secondary | ICD-10-CM

## 2018-03-05 DIAGNOSIS — O09219 Supervision of pregnancy with history of pre-term labor, unspecified trimester: Principal | ICD-10-CM

## 2018-03-05 MED ORDER — HYDROXYPROGESTERONE CAPROATE 275 MG/1.1ML ~~LOC~~ SOAJ
275.0000 mg | Freq: Once | SUBCUTANEOUS | Status: AC
  Administered 2018-03-05: 275 mg via SUBCUTANEOUS

## 2018-03-05 NOTE — Progress Notes (Signed)
Patient is in the office for 17-p injection, administered and pt tolerated well. Pt states that she does not want to rotate arms, only agrees to get injection in the right arm .Marland Kitchen Administrations This Visit    HYDROXYprogesterone Caproate SOAJ 275 mg    Admin Date 03/05/2018 Action Given Dose 275 mg Route Subcutaneous Administered By Katrina Stack, RN

## 2018-03-05 NOTE — Progress Notes (Signed)
I have reviewed the chart and agree with nursing staff's documentation of this patient's encounter.  Catalina Antigua, MD 03/05/2018 4:46 PM

## 2018-03-11 ENCOUNTER — Ambulatory Visit (INDEPENDENT_AMBULATORY_CARE_PROVIDER_SITE_OTHER): Payer: BC Managed Care – PPO

## 2018-03-11 DIAGNOSIS — O09899 Supervision of other high risk pregnancies, unspecified trimester: Secondary | ICD-10-CM

## 2018-03-11 DIAGNOSIS — O09212 Supervision of pregnancy with history of pre-term labor, second trimester: Secondary | ICD-10-CM | POA: Diagnosis not present

## 2018-03-11 DIAGNOSIS — O09219 Supervision of pregnancy with history of pre-term labor, unspecified trimester: Principal | ICD-10-CM

## 2018-03-11 MED ORDER — HYDROXYPROGESTERONE CAPROATE 275 MG/1.1ML ~~LOC~~ SOAJ
275.0000 mg | Freq: Once | SUBCUTANEOUS | Status: AC
Start: 2018-03-11 — End: 2018-03-11
  Administered 2018-03-11: 275 mg via SUBCUTANEOUS

## 2018-03-11 NOTE — Progress Notes (Signed)
Pt here for 17p inj. Inj given in left arm. Pt tolerated well.

## 2018-03-12 NOTE — Progress Notes (Signed)
I have reviewed the chart and agree with nursing staff's documentation of this patient's encounter.  Catalina AntiguaPeggy Loveah Like, MD 03/12/2018 8:15 AM

## 2018-03-18 ENCOUNTER — Ambulatory Visit: Payer: BC Managed Care – PPO

## 2018-03-18 VITALS — BP 129/84 | HR 89 | Wt 192.8 lb

## 2018-03-18 DIAGNOSIS — O09899 Supervision of other high risk pregnancies, unspecified trimester: Secondary | ICD-10-CM

## 2018-03-18 DIAGNOSIS — O09219 Supervision of pregnancy with history of pre-term labor, unspecified trimester: Principal | ICD-10-CM

## 2018-03-18 MED ORDER — HYDROXYPROGESTERONE CAPROATE 275 MG/1.1ML ~~LOC~~ SOAJ
275.0000 mg | Freq: Once | SUBCUTANEOUS | Status: AC
  Administered 2018-03-18: 275 mg via SUBCUTANEOUS

## 2018-03-18 NOTE — Progress Notes (Signed)
Pt is here for 17p injection. Injection given in L arm. Pt tolerated well.

## 2018-03-25 ENCOUNTER — Ambulatory Visit (INDEPENDENT_AMBULATORY_CARE_PROVIDER_SITE_OTHER): Payer: BC Managed Care – PPO | Admitting: Obstetrics and Gynecology

## 2018-03-25 ENCOUNTER — Encounter: Payer: Self-pay | Admitting: Obstetrics and Gynecology

## 2018-03-25 VITALS — BP 135/78 | HR 86 | Wt 194.7 lb

## 2018-03-25 DIAGNOSIS — O0992 Supervision of high risk pregnancy, unspecified, second trimester: Secondary | ICD-10-CM

## 2018-03-25 DIAGNOSIS — O09219 Supervision of pregnancy with history of pre-term labor, unspecified trimester: Secondary | ICD-10-CM

## 2018-03-25 DIAGNOSIS — O09899 Supervision of other high risk pregnancies, unspecified trimester: Secondary | ICD-10-CM

## 2018-03-25 DIAGNOSIS — O09212 Supervision of pregnancy with history of pre-term labor, second trimester: Secondary | ICD-10-CM

## 2018-03-25 MED ORDER — HYDROXYPROGESTERONE CAPROATE 275 MG/1.1ML ~~LOC~~ SOAJ
275.0000 mg | Freq: Once | SUBCUTANEOUS | Status: AC
  Administered 2018-03-25: 275 mg via SUBCUTANEOUS

## 2018-03-25 NOTE — Patient Instructions (Signed)
Etonogestrel implant What is this medicine? ETONOGESTREL (et oh noe JES trel) is a contraceptive (birth control) device. It is used to prevent pregnancy. It can be used for up to 3 years. This medicine may be used for other purposes; ask your health care provider or pharmacist if you have questions. COMMON BRAND NAME(S): Implanon, Nexplanon What should I tell my health care provider before I take this medicine? They need to know if you have any of these conditions: -abnormal vaginal bleeding -blood vessel disease or blood clots -cancer of the breast, cervix, or liver -depression -diabetes -gallbladder disease -headaches -heart disease or recent heart attack -high blood pressure -high cholesterol -kidney disease -liver disease -renal disease -seizures -tobacco smoker -an unusual or allergic reaction to etonogestrel, other hormones, anesthetics or antiseptics, medicines, foods, dyes, or preservatives -pregnant or trying to get pregnant -breast-feeding How should I use this medicine? This device is inserted just under the skin on the inner side of your upper arm by a health care professional. Talk to your pediatrician regarding the use of this medicine in children. Special care may be needed. Overdosage: If you think you have taken too much of this medicine contact a poison control center or emergency room at once. NOTE: This medicine is only for you. Do not share this medicine with others. What if I miss a dose? This does not apply. What may interact with this medicine? Do not take this medicine with any of the following medications: -amprenavir -bosentan -fosamprenavir This medicine may also interact with the following medications: -barbiturate medicines for inducing sleep or treating seizures -certain medicines for fungal infections like ketoconazole and itraconazole -grapefruit juice -griseofulvin -medicines to treat seizures like carbamazepine, felbamate, oxcarbazepine,  phenytoin, topiramate -modafinil -phenylbutazone -rifampin -rufinamide -some medicines to treat HIV infection like atazanavir, indinavir, lopinavir, nelfinavir, tipranavir, ritonavir -St. John's wort This list may not describe all possible interactions. Give your health care provider a list of all the medicines, herbs, non-prescription drugs, or dietary supplements you use. Also tell them if you smoke, drink alcohol, or use illegal drugs. Some items may interact with your medicine. What should I watch for while using this medicine? This product does not protect you against HIV infection (AIDS) or other sexually transmitted diseases. You should be able to feel the implant by pressing your fingertips over the skin where it was inserted. Contact your doctor if you cannot feel the implant, and use a non-hormonal birth control method (such as condoms) until your doctor confirms that the implant is in place. If you feel that the implant may have broken or become bent while in your arm, contact your healthcare provider. What side effects may I notice from receiving this medicine? Side effects that you should report to your doctor or health care professional as soon as possible: -allergic reactions like skin rash, itching or hives, swelling of the face, lips, or tongue -breast lumps -changes in emotions or moods -depressed mood -heavy or prolonged menstrual bleeding -pain, irritation, swelling, or bruising at the insertion site -scar at site of insertion -signs of infection at the insertion site such as fever, and skin redness, pain or discharge -signs of pregnancy -signs and symptoms of a blood clot such as breathing problems; changes in vision; chest pain; severe, sudden headache; pain, swelling, warmth in the leg; trouble speaking; sudden numbness or weakness of the face, arm or leg -signs and symptoms of liver injury like dark yellow or brown urine; general ill feeling or flu-like symptoms;  light-colored   stools; loss of appetite; nausea; right upper belly pain; unusually weak or tired; yellowing of the eyes or skin -unusual vaginal bleeding, discharge -signs and symptoms of a stroke like changes in vision; confusion; trouble speaking or understanding; severe headaches; sudden numbness or weakness of the face, arm or leg; trouble walking; dizziness; loss of balance or coordination Side effects that usually do not require medical attention (report to your doctor or health care professional if they continue or are bothersome): -acne -back pain -breast pain -changes in weight -dizziness -general ill feeling or flu-like symptoms -headache -irregular menstrual bleeding -nausea -sore throat -vaginal irritation or inflammation This list may not describe all possible side effects. Call your doctor for medical advice about side effects. You may report side effects to FDA at 1-800-FDA-1088. Where should I keep my medicine? This drug is given in a hospital or clinic and will not be stored at home. NOTE: This sheet is a summary. It may not cover all possible information. If you have questions about this medicine, talk to your doctor, pharmacist, or health care provider.  2018 Elsevier/Gold Standard (2016-04-13 11:19:22)  

## 2018-03-25 NOTE — Progress Notes (Signed)
   PRENATAL VISIT NOTE  Subjective:  Leslie Cook is a 32 y.o. G2P0101 at 6850w4d being seen today for ongoing prenatal care.  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  Patient reports no complaints.  Contractions: Not present. Vag. Bleeding: None.  Movement: Present. Denies leaking of fluid.   The following portions of the patient's history were reviewed and updated as appropriate: allergies, current medications, past family history, past medical history, past social history, past surgical history and problem list. Problem list updated.  Objective:   Vitals:   03/25/18 0905  BP: 135/78  Pulse: 86  Weight: 194 lb 11.2 oz (88.3 kg)    Fetal Status: Fetal Heart Rate (bpm): 145 Fundal Height: 26 cm Movement: Present     General:  Alert, oriented and cooperative. Patient is in no acute distress.  Skin: Skin is warm and dry. No rash noted.   Cardiovascular: Normal heart rate noted  Respiratory: Normal respiratory effort, no problems with respiration noted  Abdomen: Soft, gravid, appropriate for gestational age.  Pain/Pressure: Absent     Pelvic: Cervical exam deferred        Extremities: Normal range of motion.  Edema: None  Mental Status: Normal mood and affect. Normal behavior. Normal judgment and thought content.   Assessment and Plan:  Pregnancy: G2P0101 at 3950w4d  1. Supervision of high risk pregnancy in second trimester Patient is doing well without complaints Patient reports the development of varicose veins. Advised the use pressure stockings She plans nexplanon for contraception Third trimester labs next visit  2. History of preterm delivery, currently pregnant Continue weekly 17-P - HYDROXYprogesterone Caproate SOAJ 275 mg  Preterm labor  symptoms and general obstetric precautions including but not limited to vaginal bleeding, contractions, leaking of fluid and fetal movement were reviewed in detail with the patient. Please refer to After Visit Summary for other counseling recommendations.  Return in about 2 weeks (around 04/08/2018) for weekly for 17-P, ROB, 2 hr glucola next visit.  No future appointments.  Catalina AntiguaPeggy Tyechia Allmendinger, MD

## 2018-03-25 NOTE — Progress Notes (Signed)
Pt here for ROB and 17p injection, injection given in R arm. Pt tolerated well.

## 2018-04-01 ENCOUNTER — Ambulatory Visit (INDEPENDENT_AMBULATORY_CARE_PROVIDER_SITE_OTHER): Payer: BC Managed Care – PPO | Admitting: *Deleted

## 2018-04-01 VITALS — BP 126/76 | HR 78

## 2018-04-01 DIAGNOSIS — O09212 Supervision of pregnancy with history of pre-term labor, second trimester: Secondary | ICD-10-CM

## 2018-04-01 DIAGNOSIS — O0992 Supervision of high risk pregnancy, unspecified, second trimester: Secondary | ICD-10-CM

## 2018-04-01 MED ORDER — HYDROXYPROGESTERONE CAPROATE 275 MG/1.1ML ~~LOC~~ SOAJ
275.0000 mg | SUBCUTANEOUS | Status: AC
  Administered 2018-04-01 – 2018-05-23 (×4): 275 mg via SUBCUTANEOUS

## 2018-04-01 NOTE — Progress Notes (Signed)
I have reviewed this chart and agree with the RN/CMA assessment and management.    K. Meryl Davis, M.D. Attending Obstetrician & Gynecologist, [facility], [facility]  

## 2018-04-01 NOTE — Progress Notes (Signed)
Pt is in office for 17p injection.  Pt tolerated injection well. Pt has no other concerns today.  BP 126/76   Pulse 78   LMP 09/08/2017   Administrations This Visit    HYDROXYprogesterone Caproate SOAJ 275 mg    Admin Date 04/01/2018 Action Given Dose 275 mg Route Subcutaneous Administered By Lanney GinsFoster, Raeley Gilmore D, CMA

## 2018-04-09 ENCOUNTER — Other Ambulatory Visit: Payer: BC Managed Care – PPO

## 2018-04-09 ENCOUNTER — Ambulatory Visit (INDEPENDENT_AMBULATORY_CARE_PROVIDER_SITE_OTHER): Payer: BC Managed Care – PPO | Admitting: Obstetrics and Gynecology

## 2018-04-09 ENCOUNTER — Encounter: Payer: Self-pay | Admitting: Obstetrics and Gynecology

## 2018-04-09 VITALS — BP 129/79 | HR 83 | Wt 193.1 lb

## 2018-04-09 DIAGNOSIS — O3110X0 Continuing pregnancy after spontaneous abortion of one fetus or more, unspecified trimester, not applicable or unspecified: Secondary | ICD-10-CM | POA: Insufficient documentation

## 2018-04-09 DIAGNOSIS — O0992 Supervision of high risk pregnancy, unspecified, second trimester: Secondary | ICD-10-CM

## 2018-04-09 DIAGNOSIS — O09219 Supervision of pregnancy with history of pre-term labor, unspecified trimester: Secondary | ICD-10-CM

## 2018-04-09 DIAGNOSIS — O09899 Supervision of other high risk pregnancies, unspecified trimester: Secondary | ICD-10-CM

## 2018-04-09 DIAGNOSIS — O3121X0 Continuing pregnancy after intrauterine death of one fetus or more, first trimester, not applicable or unspecified: Secondary | ICD-10-CM

## 2018-04-09 NOTE — Progress Notes (Signed)
Subjective:  Leslie Cook is a 32 y.o. G2P0101 at 7036w5d being seen today for ongoing prenatal care.  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  Patient reports soreness at 17 OHP injection sites.  Contractions: Not present. Vag. Bleeding: None.  Movement: Present. Denies leaking of fluid.   The following portions of the patient's history were reviewed and updated as appropriate: allergies, current medications, past family history, past medical history, past social history, past surgical history and problem list. Problem list updated.  Objective:   Vitals:   04/09/18 0848  BP: 129/79  Pulse: 83  Weight: 193 lb 1.6 oz (87.6 kg)    Fetal Status: Fetal Heart Rate (bpm): 140   Movement: Present     General:  Alert, oriented and cooperative. Patient is in no acute distress.  Skin: Skin is warm and dry. No rash noted.   Cardiovascular: Normal heart rate noted  Respiratory: Normal respiratory effort, no problems with respiration noted  Abdomen: Soft, gravid, appropriate for gestational age. Pain/Pressure: Absent     Pelvic:  Cervical exam deferred        Extremities: Normal range of motion.  Edema: None  Mental Status: Normal mood and affect. Normal behavior. Normal judgment and thought content.   Urinalysis:      Assessment and Plan:  Pregnancy: G2P0101 at 6836w5d  1. Supervision of high risk pregnancy in second trimester Stable - Glucose Tolerance, 2 Hours w/1 Hour - RPR - CBC - HIV antibody (with reflex)  2. Vanishing twin syndrome Stable  3. History of preterm delivery, currently pregnant Secondary to PROM at 36 weeks Weekly 17 OHP  Preterm labor symptoms and general obstetric precautions including but not limited to  vaginal bleeding, contractions, leaking of fluid and fetal movement were reviewed in detail with the patient. Please refer to After Visit Summary for other counseling recommendations.  Return in about 2 weeks (around 04/23/2018) for OB visit.   Hermina StaggersErvin, Takari Duncombe L, MD

## 2018-04-09 NOTE — Patient Instructions (Signed)
Third Trimester of Pregnancy The third trimester is from week 28 through week 40 (months 7 through 9). The third trimester is a time when the unborn baby (fetus) is growing rapidly. At the end of the ninth month, the fetus is about 20 inches in length and weighs 6-10 pounds. Body changes during your third trimester Your body will continue to go through many changes during pregnancy. The changes vary from woman to woman. During the third trimester:  Your weight will continue to increase. You can expect to gain 25-35 pounds (11-16 kg) by the end of the pregnancy.  You may begin to get stretch marks on your hips, abdomen, and breasts.  You may urinate more often because the fetus is moving lower into your pelvis and pressing on your bladder.  You may develop or continue to have heartburn. This is caused by increased hormones that slow down muscles in the digestive tract.  You may develop or continue to have constipation because increased hormones slow digestion and cause the muscles that push waste through your intestines to relax.  You may develop hemorrhoids. These are swollen veins (varicose veins) in the rectum that can itch or be painful.  You may develop swollen, bulging veins (varicose veins) in your legs.  You may have increased body aches in the pelvis, back, or thighs. This is due to weight gain and increased hormones that are relaxing your joints.  You may have changes in your hair. These can include thickening of your hair, rapid growth, and changes in texture. Some women also have hair loss during or after pregnancy, or hair that feels dry or thin. Your hair will most likely return to normal after your baby is born.  Your breasts will continue to grow and they will continue to become tender. A yellow fluid (colostrum) may leak from your breasts. This is the first milk you are producing for your baby.  Your belly button may stick out.  You may notice more swelling in your hands,  face, or ankles.  You may have increased tingling or numbness in your hands, arms, and legs. The skin on your belly may also feel numb.  You may feel short of breath because of your expanding uterus.  You may have more problems sleeping. This can be caused by the size of your belly, increased need to urinate, and an increase in your body's metabolism.  You may notice the fetus "dropping," or moving lower in your abdomen (lightening).  You may have increased vaginal discharge.  You may notice your joints feel loose and you may have pain around your pelvic bone.  What to expect at prenatal visits You will have prenatal exams every 2 weeks until week 36. Then you will have weekly prenatal exams. During a routine prenatal visit:  You will be weighed to make sure you and the baby are growing normally.  Your blood pressure will be taken.  Your abdomen will be measured to track your baby's growth.  The fetal heartbeat will be listened to.  Any test results from the previous visit will be discussed.  You may have a cervical check near your due date to see if your cervix has softened or thinned (effaced).  You will be tested for Group B streptococcus. This happens between 35 and 37 weeks.  Your health care provider may ask you:  What your birth plan is.  How you are feeling.  If you are feeling the baby move.  If you have had   any abnormal symptoms, such as leaking fluid, bleeding, severe headaches, or abdominal cramping.  If you are using any tobacco products, including cigarettes, chewing tobacco, and electronic cigarettes.  If you have any questions.  Other tests or screenings that may be performed during your third trimester include:  Blood tests that check for low iron levels (anemia).  Fetal testing to check the health, activity level, and growth of the fetus. Testing is done if you have certain medical conditions or if there are problems during the  pregnancy.  Nonstress test (NST). This test checks the health of your baby to make sure there are no signs of problems, such as the baby not getting enough oxygen. During this test, a belt is placed around your belly. The baby is made to move, and its heart rate is monitored during movement.  What is false labor? False labor is a condition in which you feel small, irregular tightenings of the muscles in the womb (contractions) that usually go away with rest, changing position, or drinking water. These are called Braxton Hicks contractions. Contractions may last for hours, days, or even weeks before true labor sets in. If contractions come at regular intervals, become more frequent, increase in intensity, or become painful, you should see your health care provider. What are the signs of labor?  Abdominal cramps.  Regular contractions that start at 10 minutes apart and become stronger and more frequent with time.  Contractions that start on the top of the uterus and spread down to the lower abdomen and back.  Increased pelvic pressure and dull back pain.  A watery or bloody mucus discharge that comes from the vagina.  Leaking of amniotic fluid. This is also known as your "water breaking." It could be a slow trickle or a gush. Let your health care provider know if it has a color or strange odor. If you have any of these signs, call your health care provider right away, even if it is before your due date. Follow these instructions at home: Medicines  Follow your health care provider's instructions regarding medicine use. Specific medicines may be either safe or unsafe to take during pregnancy.  Take a prenatal vitamin that contains at least 600 micrograms (mcg) of folic acid.  If you develop constipation, try taking a stool softener if your health care provider approves. Eating and drinking  Eat a balanced diet that includes fresh fruits and vegetables, whole grains, good sources of protein  such as meat, eggs, or tofu, and low-fat dairy. Your health care provider will help you determine the amount of weight gain that is right for you.  Avoid raw meat and uncooked cheese. These carry germs that can cause birth defects in the baby.  If you have low calcium intake from food, talk to your health care provider about whether you should take a daily calcium supplement.  Eat four or five small meals rather than three large meals a day.  Limit foods that are high in fat and processed sugars, such as fried and sweet foods.  To prevent constipation: ? Drink enough fluid to keep your urine clear or pale yellow. ? Eat foods that are high in fiber, such as fresh fruits and vegetables, whole grains, and beans. Activity  Exercise only as directed by your health care provider. Most women can continue their usual exercise routine during pregnancy. Try to exercise for 30 minutes at least 5 days a week. Stop exercising if you experience uterine contractions.  Avoid heavy   lifting.  Do not exercise in extreme heat or humidity, or at high altitudes.  Wear low-heel, comfortable shoes.  Practice good posture.  You may continue to have sex unless your health care provider tells you otherwise. Relieving pain and discomfort  Take frequent breaks and rest with your legs elevated if you have leg cramps or low back pain.  Take warm sitz baths to soothe any pain or discomfort caused by hemorrhoids. Use hemorrhoid cream if your health care provider approves.  Wear a good support bra to prevent discomfort from breast tenderness.  If you develop varicose veins: ? Wear support pantyhose or compression stockings as told by your healthcare provider. ? Elevate your feet for 15 minutes, 3-4 times a day. Prenatal care  Write down your questions. Take them to your prenatal visits.  Keep all your prenatal visits as told by your health care provider. This is important. Safety  Wear your seat belt at  all times when driving.  Make a list of emergency phone numbers, including numbers for family, friends, the hospital, and police and fire departments. General instructions  Avoid cat litter boxes and soil used by cats. These carry germs that can cause birth defects in the baby. If you have a cat, ask someone to clean the litter box for you.  Do not travel far distances unless it is absolutely necessary and only with the approval of your health care provider.  Do not use hot tubs, steam rooms, or saunas.  Do not drink alcohol.  Do not use any products that contain nicotine or tobacco, such as cigarettes and e-cigarettes. If you need help quitting, ask your health care provider.  Do not use any medicinal herbs or unprescribed drugs. These chemicals affect the formation and growth of the baby.  Do not douche or use tampons or scented sanitary pads.  Do not cross your legs for long periods of time.  To prepare for the arrival of your baby: ? Take prenatal classes to understand, practice, and ask questions about labor and delivery. ? Make a trial run to the hospital. ? Visit the hospital and tour the maternity area. ? Arrange for maternity or paternity leave through employers. ? Arrange for family and friends to take care of pets while you are in the hospital. ? Purchase a rear-facing car seat and make sure you know how to install it in your car. ? Pack your hospital bag. ? Prepare the baby's nursery. Make sure to remove all pillows and stuffed animals from the baby's crib to prevent suffocation.  Visit your dentist if you have not gone during your pregnancy. Use a soft toothbrush to brush your teeth and be gentle when you floss. Contact a health care provider if:  You are unsure if you are in labor or if your water has broken.  You become dizzy.  You have mild pelvic cramps, pelvic pressure, or nagging pain in your abdominal area.  You have lower back pain.  You have persistent  nausea, vomiting, or diarrhea.  You have an unusual or bad smelling vaginal discharge.  You have pain when you urinate. Get help right away if:  Your water breaks before 37 weeks.  You have regular contractions less than 5 minutes apart before 37 weeks.  You have a fever.  You are leaking fluid from your vagina.  You have spotting or bleeding from your vagina.  You have severe abdominal pain or cramping.  You have rapid weight loss or weight gain.    You have shortness of breath with chest pain.  You notice sudden or extreme swelling of your face, hands, ankles, feet, or legs.  Your baby makes fewer than 10 movements in 2 hours.  You have severe headaches that do not go away when you take medicine.  You have vision changes. Summary  The third trimester is from week 28 through week 40, months 7 through 9. The third trimester is a time when the unborn baby (fetus) is growing rapidly.  During the third trimester, your discomfort may increase as you and your baby continue to gain weight. You may have abdominal, leg, and back pain, sleeping problems, and an increased need to urinate.  During the third trimester your breasts will keep growing and they will continue to become tender. A yellow fluid (colostrum) may leak from your breasts. This is the first milk you are producing for your baby.  False labor is a condition in which you feel small, irregular tightenings of the muscles in the womb (contractions) that eventually go away. These are called Braxton Hicks contractions. Contractions may last for hours, days, or even weeks before true labor sets in.  Signs of labor can include: abdominal cramps; regular contractions that start at 10 minutes apart and become stronger and more frequent with time; watery or bloody mucus discharge that comes from the vagina; increased pelvic pressure and dull back pain; and leaking of amniotic fluid. This information is not intended to replace advice  given to you by your health care provider. Make sure you discuss any questions you have with your health care provider. Document Released: 09/19/2001 Document Revised: 03/02/2016 Document Reviewed: 11/26/2012 Elsevier Interactive Patient Education  2017 [employer]  

## 2018-04-09 NOTE — Progress Notes (Signed)
Patient reports good fetal movement, denies pain. Gave info about tdap., administered 17-p and pt tolerated well.

## 2018-04-10 LAB — CBC
HEMATOCRIT: 38.4 % (ref 34.0–46.6)
Hemoglobin: 12.7 g/dL (ref 11.1–15.9)
MCH: 30.8 pg (ref 26.6–33.0)
MCHC: 33.1 g/dL (ref 31.5–35.7)
MCV: 93 fL (ref 79–97)
PLATELETS: 351 10*3/uL (ref 150–450)
RBC: 4.13 x10E6/uL (ref 3.77–5.28)
RDW: 13.6 % (ref 12.3–15.4)
WBC: 11.7 10*3/uL — ABNORMAL HIGH (ref 3.4–10.8)

## 2018-04-10 LAB — HIV ANTIBODY (ROUTINE TESTING W REFLEX): HIV Screen 4th Generation wRfx: NONREACTIVE

## 2018-04-10 LAB — GLUCOSE TOLERANCE, 2 HOURS W/ 1HR
GLUCOSE, 1 HOUR: 181 mg/dL — AB (ref 65–179)
GLUCOSE, FASTING: 79 mg/dL (ref 65–91)
Glucose, 2 hour: 138 mg/dL (ref 65–152)

## 2018-04-10 LAB — RPR: RPR: NONREACTIVE

## 2018-04-15 ENCOUNTER — Ambulatory Visit: Payer: BC Managed Care – PPO

## 2018-04-15 ENCOUNTER — Telehealth: Payer: Self-pay

## 2018-04-15 ENCOUNTER — Other Ambulatory Visit: Payer: Self-pay

## 2018-04-15 DIAGNOSIS — O0992 Supervision of high risk pregnancy, unspecified, second trimester: Secondary | ICD-10-CM

## 2018-04-15 DIAGNOSIS — O24419 Gestational diabetes mellitus in pregnancy, unspecified control: Secondary | ICD-10-CM

## 2018-04-15 DIAGNOSIS — O09219 Supervision of pregnancy with history of pre-term labor, unspecified trimester: Principal | ICD-10-CM

## 2018-04-15 DIAGNOSIS — O09899 Supervision of other high risk pregnancies, unspecified trimester: Secondary | ICD-10-CM

## 2018-04-15 MED ORDER — ACCU-CHEK FASTCLIX LANCETS MISC: 100.0000 | Freq: Four times a day (QID) | 12 refills | Status: DC

## 2018-04-15 MED ORDER — HYDROXYPROGESTERONE CAPROATE 275 MG/1.1ML ~~LOC~~ SOAJ
275.0000 mg | Freq: Once | SUBCUTANEOUS | Status: AC
  Administered 2018-04-15: 275 mg via SUBCUTANEOUS

## 2018-04-15 MED ORDER — ACCU-CHEK AVIVA PLUS W/DEVICE KIT: 1.0000 g | PACK | Freq: Four times a day (QID) | 0 refills | Status: DC

## 2018-04-15 MED ORDER — GLUCOSE BLOOD VI STRP: ORAL_STRIP | 12 refills | Status: DC

## 2018-04-15 NOTE — Progress Notes (Unsigned)
Patient notified of results, Rx and to expect a call from [facility] for appt. She verbalized understanding.

## 2018-04-15 NOTE — Progress Notes (Signed)
I have reviewed the chart and agree with nursing staff's documentation of this patient's encounter.  Leslie AntiguaPeggy Kaelani Kendrick, MD 04/15/2018 1:52 PM

## 2018-04-15 NOTE — Telephone Encounter (Signed)
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 Please refer to nutrition. Please send in glucose monitor/strips and lancets Begin monitoring BS fasting and 2 hr PP Goals fasting < 95 and 2 hr PP < 120 Thanks Casimiro NeedleMichael

## 2018-04-15 NOTE — Telephone Encounter (Signed)
Patient notified of results, Rx and to expect a call from [facility] for appt.  She verbalized understanding.

## 2018-04-15 NOTE — Progress Notes (Signed)
Patient present for 17 injecton Given in Left Deltoid w no problems  Patient supplied

## 2018-04-25 ENCOUNTER — Encounter: Payer: BC Managed Care – PPO | Admitting: Obstetrics and Gynecology

## 2018-04-26 ENCOUNTER — Ambulatory Visit (INDEPENDENT_AMBULATORY_CARE_PROVIDER_SITE_OTHER): Payer: BC Managed Care – PPO

## 2018-04-26 DIAGNOSIS — O09213 Supervision of pregnancy with history of pre-term labor, third trimester: Secondary | ICD-10-CM | POA: Diagnosis not present

## 2018-04-26 DIAGNOSIS — O09219 Supervision of pregnancy with history of pre-term labor, unspecified trimester: Principal | ICD-10-CM

## 2018-04-26 DIAGNOSIS — O09899 Supervision of other high risk pregnancies, unspecified trimester: Secondary | ICD-10-CM

## 2018-04-26 NOTE — Progress Notes (Signed)
Nurse visit for 17p given R arm w/o difficulty.

## 2018-05-01 ENCOUNTER — Encounter: Payer: BC Managed Care – PPO | Attending: Obstetrics and Gynecology | Admitting: Registered"

## 2018-05-01 DIAGNOSIS — O24419 Gestational diabetes mellitus in pregnancy, unspecified control: Secondary | ICD-10-CM | POA: Diagnosis not present

## 2018-05-01 DIAGNOSIS — O0993 Supervision of high risk pregnancy, unspecified, third trimester: Secondary | ICD-10-CM | POA: Diagnosis not present

## 2018-05-01 DIAGNOSIS — Z3A28 28 weeks gestation of pregnancy: Secondary | ICD-10-CM | POA: Diagnosis not present

## 2018-05-01 DIAGNOSIS — Z713 Dietary counseling and surveillance: Secondary | ICD-10-CM | POA: Diagnosis present

## 2018-05-01 DIAGNOSIS — O99810 Abnormal glucose complicating pregnancy: Secondary | ICD-10-CM

## 2018-05-02 ENCOUNTER — Ambulatory Visit (INDEPENDENT_AMBULATORY_CARE_PROVIDER_SITE_OTHER): Payer: BC Managed Care – PPO | Admitting: Obstetrics and Gynecology

## 2018-05-02 ENCOUNTER — Encounter: Payer: Self-pay | Admitting: Registered"

## 2018-05-02 VITALS — BP 118/76 | HR 90 | Wt 198.0 lb

## 2018-05-02 DIAGNOSIS — Z8632 Personal history of gestational diabetes: Secondary | ICD-10-CM | POA: Insufficient documentation

## 2018-05-02 DIAGNOSIS — O09219 Supervision of pregnancy with history of pre-term labor, unspecified trimester: Secondary | ICD-10-CM

## 2018-05-02 DIAGNOSIS — O99810 Abnormal glucose complicating pregnancy: Secondary | ICD-10-CM | POA: Insufficient documentation

## 2018-05-02 DIAGNOSIS — O09899 Supervision of other high risk pregnancies, unspecified trimester: Secondary | ICD-10-CM

## 2018-05-02 DIAGNOSIS — O24419 Gestational diabetes mellitus in pregnancy, unspecified control: Secondary | ICD-10-CM | POA: Insufficient documentation

## 2018-05-02 DIAGNOSIS — O0993 Supervision of high risk pregnancy, unspecified, third trimester: Secondary | ICD-10-CM

## 2018-05-02 DIAGNOSIS — O99321 Drug use complicating pregnancy, first trimester: Secondary | ICD-10-CM

## 2018-05-02 DIAGNOSIS — O24410 Gestational diabetes mellitus in pregnancy, diet controlled: Secondary | ICD-10-CM

## 2018-05-02 DIAGNOSIS — O3121X0 Continuing pregnancy after intrauterine death of one fetus or more, first trimester, not applicable or unspecified: Secondary | ICD-10-CM

## 2018-05-02 DIAGNOSIS — O3110X0 Continuing pregnancy after spontaneous abortion of one fetus or more, unspecified trimester, not applicable or unspecified: Secondary | ICD-10-CM

## 2018-05-02 MED ORDER — GLUCOSE BLOOD VI STRP: ORAL_STRIP | 12 refills | Status: DC

## 2018-05-02 MED ORDER — HYDROXYPROGESTERONE CAPROATE 275 MG/1.1ML ~~LOC~~ SOAJ
275.0000 mg | Freq: Once | SUBCUTANEOUS | Status: AC
  Administered 2018-05-02: 275 mg via SUBCUTANEOUS

## 2018-05-02 NOTE — Progress Notes (Addendum)
   PRENATAL VISIT NOTE  Subjective:  Ned Clinesolisha San is a 32 y.o. G2P0101 at 2742w0d being seen today for ongoing prenatal care.  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  Patient reports no complaints.  Contractions: Not present. Vag. Bleeding: None.  Movement: Present. Denies leaking of fluid.   The following portions of the patient's history were reviewed and updated as appropriate: allergies, current medications, past family history, past medical history, past social history, past surgical history and problem list. Problem list updated.  Objective:   Vitals:   05/02/18 0835  BP: 118/76  Pulse: 90  Weight: 198 lb (89.8 kg)    Fetal Status: Fetal Heart Rate (bpm): 152 Fundal Height: 32 cm Movement: Present     General:  Alert, oriented and cooperative. Patient is in no acute distress.  Skin: Skin is warm and dry. No rash noted.   Cardiovascular: Normal heart rate noted  Respiratory: Normal respiratory effort, no problems with respiration noted  Abdomen: Soft, gravid, appropriate for gestational age.  Pain/Pressure: Absent     Pelvic: Cervical exam deferred        Extremities: Normal range of motion.  Edema: None  Mental Status: Normal mood and affect. Normal behavior. Normal judgment and thought content.   Assessment and Plan:  Pregnancy: G2P0101 at 2642w0d  1. History of preterm delivery, currently pregnant - HYDROXYprogesterone Caproate SOAJ 275 mg  2. Supervision of high risk pregnancy in third trimester  3. Vanishing twin syndrome  4. 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 Diet controlled gestational diabetes  mellitus (GDM) in third trimester Reviewed gDM, expected course, answered all questions FG this am 3281 (only started this am) Encouraged her to continue checking CBG Growth US ordered  Preterm labor symptoms and general obstetric precautions including but not limited to vaginal bleeding, contractions, leaking of fluid and fetal movement were reviewed in detail with the patient. Please refer to After Visit Summary for other counseling recommendations.  Return in about 10 days (around 05/12/2018) for OB visit (MD).  Future Appointments  Date Time Provider Department Center  05/09/2018  9:30 AM [facility] NURSE [facility] None  05/16/2018  9:30 AM Constant, Gigi GinPeggy, MD [facility] None  06/03/2018 11:00 AM [facility] US 3 [facility]    Conan BowensKelly M Faaris Arizpe, MD

## 2018-05-02 NOTE — Progress Notes (Signed)
Pt is 32 yo G2P1 here for ROB.  32 w and currently monitoring BG levels, states her fasting level this morning was 81. Makena inj given today, pt tolerated well.

## 2018-05-02 NOTE — Progress Notes (Signed)
Patient was seen on 05/01/18 for Gestational Diabetes self-management class at the [facility]. The following learning objectives were met by the patient during this course:   States the definition of Gestational Diabetes  States why dietary management is important in controlling blood glucose  Describes the effects each nutrient has on blood glucose levels  Demonstrates ability to create a balanced meal plan  Demonstrates carbohydrate counting   States when to check blood glucose levels  Demonstrates proper blood glucose monitoring techniques  States the effect of stress and exercise on blood glucose levels  States the importance of limiting caffeine and abstaining from alcohol and smoking  Blood glucose monitor given: none   Patient instructed to monitor glucose levels: FBS: 60 - <95; 1 hour: <140; 2 hour: <120  Patient received handouts:  Nutrition Diabetes and Pregnancy, including carb counting list  Patient will be seen for follow-up as needed.

## 2018-05-06 ENCOUNTER — Other Ambulatory Visit: Payer: Self-pay

## 2018-05-06 DIAGNOSIS — O24410 Gestational diabetes mellitus in pregnancy, diet controlled: Secondary | ICD-10-CM

## 2018-05-06 MED ORDER — ACCU-CHEK SOFTCLIX LANCETS MISC
12 refills | Status: DC
Start: 2018-05-06 — End: 2018-06-28

## 2018-05-06 NOTE — Progress Notes (Signed)
Pharmacy is requesting pt be sent "softclix" lancets. Rx sent.

## 2018-05-09 ENCOUNTER — Ambulatory Visit: Payer: BC Managed Care – PPO

## 2018-05-09 VITALS — BP 111/77 | HR 80 | Wt 198.0 lb

## 2018-05-09 DIAGNOSIS — O09219 Supervision of pregnancy with history of pre-term labor, unspecified trimester: Principal | ICD-10-CM

## 2018-05-09 DIAGNOSIS — O09899 Supervision of other high risk pregnancies, unspecified trimester: Secondary | ICD-10-CM

## 2018-05-09 MED ORDER — HYDROXYPROGESTERONE CAPROATE 275 MG/1.1ML ~~LOC~~ SOAJ
275.0000 mg | Freq: Once | SUBCUTANEOUS | Status: AC
Start: 2018-05-09 — End: 2018-05-09
  Administered 2018-05-09: 275 mg via SUBCUTANEOUS

## 2018-05-09 NOTE — Progress Notes (Signed)
Pt is here for 17p injection only. Pt denies any contractions or bleeding. Pt states baby is moving well and very active. Injection given without difficulty

## 2018-05-16 ENCOUNTER — Telehealth: Payer: Self-pay

## 2018-05-16 ENCOUNTER — Ambulatory Visit (INDEPENDENT_AMBULATORY_CARE_PROVIDER_SITE_OTHER): Payer: BC Managed Care – PPO | Admitting: Obstetrics and Gynecology

## 2018-05-16 ENCOUNTER — Encounter: Payer: Self-pay | Admitting: Obstetrics and Gynecology

## 2018-05-16 VITALS — BP 131/81 | HR 96 | Wt 199.3 lb

## 2018-05-16 DIAGNOSIS — O09899 Supervision of other high risk pregnancies, unspecified trimester: Secondary | ICD-10-CM

## 2018-05-16 DIAGNOSIS — O09219 Supervision of pregnancy with history of pre-term labor, unspecified trimester: Secondary | ICD-10-CM

## 2018-05-16 DIAGNOSIS — O0993 Supervision of high risk pregnancy, unspecified, third trimester: Secondary | ICD-10-CM

## 2018-05-16 DIAGNOSIS — O24410 Gestational diabetes mellitus in pregnancy, diet controlled: Secondary | ICD-10-CM

## 2018-05-16 MED ORDER — HYDROXYPROGESTERONE CAPROATE 275 MG/1.1ML ~~LOC~~ SOAJ
275.0000 mg | Freq: Once | SUBCUTANEOUS | Status: AC
  Administered 2018-05-16: 275 mg via SUBCUTANEOUS

## 2018-05-16 NOTE — Telephone Encounter (Signed)
Pre Auth completed and approved via phone  On call for over 38 mins  Pt Makena should arrive before 05/24/18.

## 2018-05-16 NOTE — Progress Notes (Signed)
   PRENATAL VISIT NOTE  Subjective:  Leslie Cook is a 32 y.o. G2P0101 at 9398w0d being seen today for ongoing prenatal care.  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  Patient reports no complaints.  Contractions: Not present. Vag. Bleeding: None.  Movement: Present. Denies leaking of fluid.   The following portions of the patient's history were reviewed and updated as appropriate: allergies, current medications, past family history, past medical history, past social history, past surgical history and problem list. Problem list updated.  Objective:   Vitals:   05/16/18 0930  BP: 131/81  Pulse: 96  Weight: 199 lb 4.8 oz (90.4 kg)    Fetal Status: Fetal Heart Rate (bpm): 146 Fundal Height: 34 cm Movement: Present     General:  Alert, oriented and cooperative. Patient is in no acute distress.  Skin: Skin is warm and dry. No rash noted.   Cardiovascular: Normal heart rate noted  Respiratory: Normal respiratory effort, no problems with respiration noted  Abdomen: Soft, gravid, appropriate for gestational age.  Pain/Pressure: Absent     Pelvic: Cervical exam deferred        Extremities: Normal range of motion.  Edema: None  Mental Status: Normal mood and affect. Normal behavior. Normal judgment and thought content.   Assessment and Plan:  Pregnancy: G2P0101 at 3898w0d  1. History of preterm delivery, currently pregnant Continue weekly 17-P - HYDROXYprogesterone Caproate SOAJ 275 mg  2. Supervision of high risk pregnancy in third trimester Patient is doing well without complaints  3. Diet controlled gestational diabetes mellitus (GDM) in third  trimester CBGs all within range Continue diet Follow-up growth ultrasound 8/26  Preterm labor symptoms and general obstetric precautions including but not limited to vaginal bleeding, contractions, leaking of fluid and fetal movement were reviewed in detail with the patient. Please refer to After Visit Summary for other counseling recommendations.  Return in about 2 weeks (around 05/30/2018) for ROB.  Future Appointments  Date Time Provider Department Center  06/03/2018 11:00 AM [facility] US 3 [facility]    Catalina AntiguaPeggy Caitrin Pendergraph, MD

## 2018-05-16 NOTE — Progress Notes (Signed)
Pt is G2P1 34w here for ROB. 17P today

## 2018-05-23 ENCOUNTER — Ambulatory Visit: Payer: BC Managed Care – PPO

## 2018-05-23 VITALS — BP 140/88 | HR 85 | Wt 199.8 lb

## 2018-05-23 DIAGNOSIS — Z8759 Personal history of other complications of pregnancy, childbirth and the puerperium: Secondary | ICD-10-CM

## 2018-05-23 DIAGNOSIS — O0993 Supervision of high risk pregnancy, unspecified, third trimester: Secondary | ICD-10-CM

## 2018-05-23 NOTE — Progress Notes (Signed)
Presents for 17P Injection, given in left arm, tolerated well.  Administrations This Visit    HYDROXYprogesterone Caproate SOAJ 275 mg    Admin Date 05/23/2018 Action Given Dose 275 mg Route Subcutaneous Administered By Maretta BeesMcGlashan, Orvel Cutsforth J, RMA

## 2018-05-28 ENCOUNTER — Encounter: Payer: BC Managed Care – PPO | Admitting: Obstetrics and Gynecology

## 2018-05-29 ENCOUNTER — Encounter: Payer: Self-pay | Admitting: Obstetrics and Gynecology

## 2018-05-29 ENCOUNTER — Ambulatory Visit (INDEPENDENT_AMBULATORY_CARE_PROVIDER_SITE_OTHER): Payer: BC Managed Care – PPO | Admitting: Obstetrics and Gynecology

## 2018-05-29 VITALS — BP 128/85 | HR 100 | Wt 202.3 lb

## 2018-05-29 DIAGNOSIS — O09219 Supervision of pregnancy with history of pre-term labor, unspecified trimester: Secondary | ICD-10-CM

## 2018-05-29 DIAGNOSIS — O09213 Supervision of pregnancy with history of pre-term labor, third trimester: Secondary | ICD-10-CM

## 2018-05-29 DIAGNOSIS — O24410 Gestational diabetes mellitus in pregnancy, diet controlled: Secondary | ICD-10-CM

## 2018-05-29 DIAGNOSIS — O0993 Supervision of high risk pregnancy, unspecified, third trimester: Secondary | ICD-10-CM

## 2018-05-29 DIAGNOSIS — O09899 Supervision of other high risk pregnancies, unspecified trimester: Secondary | ICD-10-CM

## 2018-05-29 MED ORDER — HYDROXYPROGESTERONE CAPROATE 275 MG/1.1ML ~~LOC~~ SOAJ
275.0000 mg | Freq: Once | SUBCUTANEOUS | Status: AC
  Administered 2018-05-29: 275 mg via SUBCUTANEOUS

## 2018-05-29 NOTE — Progress Notes (Signed)
   PRENATAL VISIT NOTE  Subjective:  Leslie Cook is a 32 y.o. G2P0101 at 3673w6d being seen today for ongoing prenatal care.  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  Patient reports no complaints.  Contractions: Not present. Vag. Bleeding: None.  Movement: Present. Denies leaking of fluid.   The following portions of the patient's history were reviewed and updated as appropriate: allergies, current medications, past family history, past medical history, past social history, past surgical history and problem list. Problem list updated.  Objective:   Vitals:   05/29/18 0944  BP: 128/85  Pulse: 100  Weight: 202 lb 4.8 oz (91.8 kg)    Fetal Status: Fetal Heart Rate (bpm): 146(Simultaneous filing. User may not have seen previous data.) Fundal Height: 36 cm Movement: Present     General:  Alert, oriented and cooperative. Patient is in no acute distress.  Skin: Skin is warm and dry. No rash noted.   Cardiovascular: Normal heart rate noted  Respiratory: Normal respiratory effort, no problems with respiration noted  Abdomen: Soft, gravid, appropriate for gestational age.  Pain/Pressure: Absent     Pelvic: Cervical exam deferred        Extremities: Normal range of motion.  Edema: None  Mental Status: Normal mood and affect. Normal behavior. Normal judgment and thought content.   Assessment and Plan:  Pregnancy: G2P0101 at 273w6d  1. Supervision of high risk pregnancy in third trimester Patient is doing well without complaints Cultures next visit  2. Diet controlled gestational diabetes mellitus (GDM) in third trimester CBGs reviewed and all  within range- diet control Ultrasound 8/26  3. History of preterm delivery, currently pregnant 17-P today  Preterm labor symptoms and general obstetric precautions including but not limited to vaginal bleeding, contractions, leaking of fluid and fetal movement were reviewed in detail with the patient. Please refer to After Visit Summary for other counseling recommendations.  Return in about 1 week (around 06/05/2018) for ROB.  Future Appointments  Date Time Provider Department Center  06/03/2018 11:00 AM [facility] US 3 [facility]    Catalina AntiguaPeggy Estaban [place], MD

## 2018-06-03 ENCOUNTER — Other Ambulatory Visit (HOSPITAL_COMMUNITY): Payer: Self-pay | Admitting: *Deleted

## 2018-06-03 ENCOUNTER — Ambulatory Visit (HOSPITAL_COMMUNITY)
Admission: RE | Admit: 2018-06-03 | Discharge: 2018-06-03 | Disposition: A | Discharge status: Home / Self Care | Payer: BC Managed Care – PPO | Source: Ambulatory Visit | Attending: Obstetrics and Gynecology | Admitting: Obstetrics and Gynecology

## 2018-06-03 ENCOUNTER — Encounter (HOSPITAL_COMMUNITY): Payer: Self-pay

## 2018-06-03 ENCOUNTER — Other Ambulatory Visit: Payer: Self-pay | Admitting: Obstetrics and Gynecology

## 2018-06-03 DIAGNOSIS — Z3A36 36 weeks gestation of pregnancy: Secondary | ICD-10-CM | POA: Diagnosis not present

## 2018-06-03 DIAGNOSIS — O99212 Obesity complicating pregnancy, second trimester: Secondary | ICD-10-CM | POA: Insufficient documentation

## 2018-06-03 DIAGNOSIS — O24410 Gestational diabetes mellitus in pregnancy, diet controlled: Secondary | ICD-10-CM

## 2018-06-03 DIAGNOSIS — O289 Unspecified abnormal findings on antenatal screening of mother: Secondary | ICD-10-CM | POA: Insufficient documentation

## 2018-06-03 DIAGNOSIS — Z3481 Encounter for supervision of other normal pregnancy, first trimester: Secondary | ICD-10-CM

## 2018-06-03 DIAGNOSIS — Z363 Encounter for antenatal screening for malformations: Secondary | ICD-10-CM

## 2018-06-03 DIAGNOSIS — O09212 Supervision of pregnancy with history of pre-term labor, second trimester: Secondary | ICD-10-CM | POA: Insufficient documentation

## 2018-06-03 DIAGNOSIS — Z362 Encounter for other antenatal screening follow-up: Secondary | ICD-10-CM | POA: Diagnosis not present

## 2018-06-03 DIAGNOSIS — O99213 Obesity complicating pregnancy, third trimester: Secondary | ICD-10-CM | POA: Diagnosis not present

## 2018-06-05 ENCOUNTER — Ambulatory Visit (INDEPENDENT_AMBULATORY_CARE_PROVIDER_SITE_OTHER): Payer: BC Managed Care – PPO | Admitting: Family Medicine

## 2018-06-05 VITALS — BP 123/84 | HR 80 | Wt 202.0 lb

## 2018-06-05 DIAGNOSIS — Z113 Encounter for screening for infections with a predominantly sexual mode of transmission: Secondary | ICD-10-CM

## 2018-06-05 DIAGNOSIS — O0993 Supervision of high risk pregnancy, unspecified, third trimester: Secondary | ICD-10-CM

## 2018-06-05 DIAGNOSIS — O24410 Gestational diabetes mellitus in pregnancy, diet controlled: Secondary | ICD-10-CM

## 2018-06-05 LAB — OB RESULTS CONSOLE GC/CHLAMYDIA: Gonorrhea: NEGATIVE

## 2018-06-05 NOTE — Progress Notes (Signed)
   PRENATAL VISIT NOTE  Subjective:  Leslie Cook is a 32 y.o. G2P0101 at 8779w6d being seen today for ongoing prenatal care.  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  Patient reports no complaints.  Contractions: Not present. Vag. Bleeding: None.  Movement: Present. Denies leaking of fluid.   The following portions of the patient's history were reviewed and updated as appropriate: allergies, current medications, past family history, past medical history, past social history, past surgical history and problem list. Problem list updated.  Objective:   Vitals:   06/05/18 1311  BP: 123/84  Pulse: 80  Weight: 202 lb (91.6 kg)    Fetal Status: Fetal Heart Rate (bpm): 140 Fundal Height: 35 cm Movement: Present  Presentation: Vertex  General:  Alert, oriented and cooperative. Patient is in no acute distress.  Skin: Skin is warm and dry. No rash noted.   Cardiovascular: Normal heart rate noted  Respiratory: Normal respiratory effort, no problems with respiration noted  Abdomen: Soft, gravid, appropriate for gestational age.  Pain/Pressure: Present     Pelvic: Cervical exam performed Dilation: 1.5 Effacement (%): 70 Station: -3  Extremities: Normal range of motion.  Edema: None  Mental Status: Normal mood and affect. Normal behavior. Normal judgment and thought content.   Assessment and Plan:  Pregnancy: G2P0101 at 2579w6d  1. Supervision of high risk pregnancy in third trimester Cultures today - Strep Gp B NAA - Cervicovaginal ancillary only  2. Diet controlled gestational diabetes mellitus (GDM) in third trimester U/s 8/26 reveals 7 lb 5 oz  3316 gms (86%) AC > 97%, vtx, nml AFI--discussed implications of accelerated AC growth Well controlled on diet FBS 76-88 2 hour pp 81-133 (2 out of range)   Preterm labor symptoms and general obstetric precautions including but not limited to vaginal bleeding, contractions, leaking of fluid and fetal movement were reviewed in detail with the patient. Please refer to After Visit Summary for other counseling recommendations.  Return in 1 week (on 06/12/2018).  Future Appointments  Date Time Provider Department Center  06/11/2018 11:15 AM [facility] US 4 [facility]  06/12/2018 10:45 AM Adam PhenixArnold, James G, MD [facility] None  06/18/2018 11:15 AM [facility] US 4 [facility]    Reva Boresanya S Pratt, MD

## 2018-06-05 NOTE — Patient Instructions (Signed)

## 2018-06-07 ENCOUNTER — Encounter: Payer: Self-pay | Admitting: Family Medicine

## 2018-06-07 DIAGNOSIS — O99820 Streptococcus B carrier state complicating pregnancy: Secondary | ICD-10-CM | POA: Insufficient documentation

## 2018-06-07 LAB — CERVICOVAGINAL ANCILLARY ONLY
Chlamydia: NEGATIVE
Neisseria Gonorrhea: NEGATIVE

## 2018-06-07 LAB — STREP GP B NAA: STREP GROUP B AG: POSITIVE — AB

## 2018-06-11 ENCOUNTER — Ambulatory Visit (HOSPITAL_COMMUNITY)
Admission: RE | Admit: 2018-06-11 | Discharge: 2018-06-11 | Disposition: A | Discharge status: Home / Self Care | Payer: BC Managed Care – PPO | Source: Ambulatory Visit | Attending: Obstetrics & Gynecology | Admitting: Obstetrics & Gynecology

## 2018-06-11 ENCOUNTER — Encounter (HOSPITAL_COMMUNITY): Payer: Self-pay

## 2018-06-11 DIAGNOSIS — Z3A37 37 weeks gestation of pregnancy: Secondary | ICD-10-CM | POA: Insufficient documentation

## 2018-06-11 DIAGNOSIS — O09213 Supervision of pregnancy with history of pre-term labor, third trimester: Secondary | ICD-10-CM

## 2018-06-11 DIAGNOSIS — O99213 Obesity complicating pregnancy, third trimester: Secondary | ICD-10-CM | POA: Diagnosis not present

## 2018-06-11 DIAGNOSIS — O24410 Gestational diabetes mellitus in pregnancy, diet controlled: Secondary | ICD-10-CM | POA: Diagnosis present

## 2018-06-11 DIAGNOSIS — O289 Unspecified abnormal findings on antenatal screening of mother: Secondary | ICD-10-CM | POA: Diagnosis not present

## 2018-06-12 ENCOUNTER — Ambulatory Visit (INDEPENDENT_AMBULATORY_CARE_PROVIDER_SITE_OTHER): Payer: BC Managed Care – PPO | Admitting: Obstetrics & Gynecology

## 2018-06-12 ENCOUNTER — Encounter: Payer: Self-pay | Admitting: Obstetrics & Gynecology

## 2018-06-12 VITALS — BP 146/87 | HR 94 | Wt 206.0 lb

## 2018-06-12 DIAGNOSIS — O0993 Supervision of high risk pregnancy, unspecified, third trimester: Secondary | ICD-10-CM

## 2018-06-12 DIAGNOSIS — O24410 Gestational diabetes mellitus in pregnancy, diet controlled: Secondary | ICD-10-CM

## 2018-06-12 NOTE — Patient Instructions (Signed)
Vaginal Delivery Vaginal delivery means that you will give birth by pushing your baby out of your birth canal (vagina). A team of health care providers will help you before, during, and after vaginal delivery. Birth experiences are unique for every woman and every pregnancy, and birth experiences vary depending on where you choose to give birth. What should I do to prepare for my baby's birth? Before your baby is born, it is important to talk with your health care provider about:  Your labor and delivery preferences. These may include: ? Medicines that you may be given. ? How you will manage your pain. This might include non-medical pain relief techniques or injectable pain relief such as epidural analgesia. ? How you and your baby will be monitored during labor and delivery. ? Who may be in the labor and delivery room with you. ? Your feelings about surgical delivery of your baby (cesarean delivery, or C-section) if this becomes necessary. ? Your feelings about receiving donated blood through an IV tube (blood transfusion) if this becomes necessary.  Whether you are able: ? To take pictures or videos of the birth. ? To eat during labor and delivery. ? To move around, walk, or change positions during labor and delivery.  What to expect after your baby is born, such as: ? Whether delayed umbilical cord clamping and cutting is offered. ? Who will care for your baby right after birth. ? Medicines or tests that may be recommended for your baby. ? Whether breastfeeding is supported in your hospital or birth center. ? How long you will be in the hospital or birth center.  How any medical conditions you have may affect your baby or your labor and delivery experience.  To prepare for your baby's birth, you should also:  Attend all of your health care visits before delivery (prenatal visits) as recommended by your health care provider. This is important.  Prepare your home for your baby's  arrival. Make sure that you have: ? Diapers. ? Baby clothing. ? Feeding equipment. ? Safe sleeping arrangements for you and your baby.  Install a car seat in your vehicle. Have your car seat checked by a certified car seat installer to make sure that it is installed safely.  Think about who will help you with your new baby at home for at least the first several weeks after delivery.  What can I expect when I arrive at the birth center or hospital? Once you are in labor and have been admitted into the hospital or birth center, your health care provider may:  Review your pregnancy history and any concerns you have.  Insert an IV tube into one of your veins. This is used to give you fluids and medicines.  Check your blood pressure, pulse, temperature, and heart rate (vital signs).  Check whether your bag of water (amniotic sac) has broken (ruptured).  Talk with you about your birth plan and discuss pain control options.  Monitoring Your health care provider may monitor your contractions (uterine monitoring) and your baby's heart rate (fetal monitoring). You may need to be monitored:  Often, but not continuously (intermittently).  All the time or for long periods at a time (continuously). Continuous monitoring may be needed if: ? You are taking certain medicines, such as medicine to relieve pain or make your contractions stronger. ? You have pregnancy or labor complications.  Monitoring may be done by:  Placing a special stethoscope or a handheld monitoring device on your abdomen to   check your baby's heartbeat, and feeling your abdomen for contractions. This method of monitoring does not continuously record your baby's heartbeat or your contractions.  Placing monitors on your abdomen (external monitors) to record your baby's heartbeat and the frequency and length of contractions. You may not have to wear external monitors all the time.  Placing monitors inside of your uterus  (internal monitors) to record your baby's heartbeat and the frequency, length, and strength of your contractions. ? Your health care provider may use internal monitors if he or she needs more information about the strength of your contractions or your baby's heart rate. ? Internal monitors are put in place by passing a thin, flexible wire through your vagina and into your uterus. Depending on the type of monitor, it may remain in your uterus or on your baby's head until birth. ? Your health care provider will discuss the benefits and risks of internal monitoring with you and will ask for your permission before inserting the monitors.  Telemetry. This is a type of continuous monitoring that can be done with external or internal monitors. Instead of having to stay in bed, you are able to move around during telemetry. Ask your health care provider if telemetry is an option for you.  Physical exam Your health care provider may perform a physical exam. This may include:  Checking whether your baby is positioned: ? With the head toward your vagina (head-down). This is most common. ? With the head toward the top of your uterus (head-up or breech). If your baby is in a breech position, your health care provider may try to turn your baby to a head-down position so you can deliver vaginally. If it does not seem that your baby can be born vaginally, your provider may recommend surgery to deliver your baby. In rare cases, you may be able to deliver vaginally if your baby is head-up (breech delivery). ? Lying sideways (transverse). Babies that are lying sideways cannot be delivered vaginally.  Checking your cervix to determine: ? Whether it is thinning out (effacing). ? Whether it is opening up (dilating). ? How low your baby has moved into your birth canal.  What are the three stages of labor and delivery?  Normal labor and delivery is divided into the following three stages: Stage 1  Stage 1 is the  longest stage of labor, and it can last for hours or days. Stage 1 includes: ? Early labor. This is when contractions may be irregular, or regular and mild. Generally, early labor contractions are more than 10 minutes apart. ? Active labor. This is when contractions get longer, more regular, more frequent, and more intense. ? The transition phase. This is when contractions happen very close together, are very intense, and may last longer than during any other part of labor.  Contractions generally feel mild, infrequent, and irregular at first. They get stronger, more frequent (about every 2-3 minutes), and more regular as you progress from early labor through active labor and transition.  Many women progress through stage 1 naturally, but you may need help to continue making progress. If this happens, your health care provider may talk with you about: ? Rupturing your amniotic sac if it has not ruptured yet. ? Giving you medicine to help make your contractions stronger and more frequent.  Stage 1 ends when your cervix is completely dilated to 4 inches (10 cm) and completely effaced. This happens at the end of the transition phase. Stage 2  Once   your cervix is completely effaced and dilated to 4 inches (10 cm), you may start to feel an urge to push. It is common for the body to naturally take a rest before feeling the urge to push, especially if you received an epidural or certain other pain medicines. This rest period may last for up to 1-2 hours, depending on your unique labor experience.  During stage 2, contractions are generally less painful, because pushing helps relieve contraction pain. Instead of contraction pain, you may feel stretching and burning pain, especially when the widest part of your baby's head passes through the vaginal opening (crowning).  Your health care provider will closely monitor your pushing progress and your baby's progress through the vagina during stage 2.  Your  health care provider may massage the area of skin between your vaginal opening and anus (perineum) or apply warm compresses to your perineum. This helps it stretch as the baby's head starts to crown, which can help prevent perineal tearing. ? In some cases, an incision may be made in your perineum (episiotomy) to allow the baby to pass through the vaginal opening. An episiotomy helps to make the opening of the vagina larger to allow more room for the baby to fit through.  It is very important to breathe and focus so your health care provider can control the delivery of your baby's head. Your health care provider may have you decrease the intensity of your pushing, to help prevent perineal tearing.  After delivery of your baby's head, the shoulders and the rest of the body generally deliver very quickly and without difficulty.  Once your baby is delivered, the umbilical cord may be cut right away, or this may be delayed for 1-2 minutes, depending on your baby's health. This may vary among health care providers, hospitals, and birth centers.  If you and your baby are healthy enough, your baby may be placed on your chest or abdomen to help maintain the baby's temperature and to help you bond with each other. Some mothers and babies start breastfeeding at this time. Your health care team will dry your baby and help keep your baby warm during this time.  Your baby may need immediate care if he or she: ? Showed signs of distress during labor. ? Has a medical condition. ? Was born too early (prematurely). ? Had a bowel movement before birth (meconium). ? Shows signs of difficulty transitioning from being inside the uterus to being outside of the uterus. If you are planning to breastfeed, your health care team will help you begin a feeding. Stage 3  The third stage of labor starts immediately after the birth of your baby and ends after you deliver the placenta. The placenta is an organ that develops  during pregnancy to provide oxygen and nutrients to your baby in the womb.  Delivering the placenta may require some pushing, and you may have mild contractions. Breastfeeding can stimulate contractions to help you deliver the placenta.  After the placenta is delivered, your uterus should tighten (contract) and become firm. This helps to stop bleeding in your uterus. To help your uterus contract and to control bleeding, your health care provider may: ? Give you medicine by injection, through an IV tube, by mouth, or through your rectum (rectally). ? Massage your abdomen or perform a vaginal exam to remove any blood clots that are left in your uterus. ? Empty your bladder by placing a thin, flexible tube (catheter) into your bladder. ? Encourage   you to breastfeed your baby. After labor is over, you and your baby will be monitored closely to ensure that you are both healthy until you are ready to go home. Your health care team will teach you how to care for yourself and your baby. This information is not intended to replace advice given to you by your health care provider. Make sure you discuss any questions you have with your health care provider. Document Released: 07/04/2008 Document Revised: 04/14/2016 Document Reviewed: 10/10/2015 [employer] Interactive Patient Education  2018 [employer]  

## 2018-06-12 NOTE — Progress Notes (Signed)
   PRENATAL VISIT NOTE  Subjective:  Leslie Cook is a 32 y.o. G2P0101 at [redacted]w[redacted]d being seen today for ongoing prenatal care.  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  Patient reports occasional contractions.  Contractions: Irritability. Vag. Bleeding: None.  Movement: Present. Denies leaking of fluid.   The following portions of the patient's history were reviewed and updated as appropriate: allergies, current medications, past family history, past medical history, past social history, past surgical history and problem list. Problem list updated.  Objective:   Vitals:   06/12/18 1102  BP: (!) 146/87  Pulse: 94  Weight: 206 lb (93.4 kg)    Fetal Status: Fetal Heart Rate (bpm): 144   Movement: Present     General:  Alert, oriented and cooperative. Patient is in no acute distress.  Skin: Skin is warm and dry. No rash noted.   Cardiovascular: Normal heart rate noted  Respiratory: Normal respiratory effort, no problems with respiration noted  Abdomen: Soft, gravid, appropriate for gestational age.  Pain/Pressure: Present     Pelvic: Cervical exam deferred        Extremities: Normal range of motion.  Edema: None  Mental Status: Normal mood and affect. Normal behavior. Normal judgment and thought content.   Assessment and Plan:  Pregnancy: G2P0101 at [redacted]w[redacted]d  There are no diagnoses linked to this encounter. Term labor symptoms and general obstetric precautions including but not limited to vaginal bleeding, contractions, leaking of fluid and fetal movement were reviewed in detail with  the patient. Please refer to After Visit Summary for other counseling recommendations.  Return in about 1 week (around 06/19/2018).  Future Appointments  Date Time Provider Department Center  06/18/2018 11:15 AM [facility] [place] 4 [facility]    Scheryl Darter, MD

## 2018-06-12 NOTE — Progress Notes (Signed)
Pt left w/o having vitals repeated.( Pt was in lobby area then called and not present )

## 2018-06-18 ENCOUNTER — Ambulatory Visit (HOSPITAL_COMMUNITY)
Admission: RE | Admit: 2018-06-18 | Discharge: 2018-06-18 | Disposition: A | Discharge status: Home / Self Care | Payer: BC Managed Care – PPO | Source: Ambulatory Visit | Attending: Obstetrics | Admitting: Obstetrics

## 2018-06-18 ENCOUNTER — Encounter (HOSPITAL_COMMUNITY): Payer: Self-pay

## 2018-06-18 DIAGNOSIS — O24410 Gestational diabetes mellitus in pregnancy, diet controlled: Secondary | ICD-10-CM | POA: Insufficient documentation

## 2018-06-18 DIAGNOSIS — O99213 Obesity complicating pregnancy, third trimester: Secondary | ICD-10-CM | POA: Insufficient documentation

## 2018-06-18 DIAGNOSIS — O99212 Obesity complicating pregnancy, second trimester: Secondary | ICD-10-CM | POA: Diagnosis not present

## 2018-06-18 DIAGNOSIS — O281 Abnormal biochemical finding on antenatal screening of mother: Secondary | ICD-10-CM | POA: Diagnosis not present

## 2018-06-18 DIAGNOSIS — E669 Obesity, unspecified: Secondary | ICD-10-CM | POA: Insufficient documentation

## 2018-06-18 DIAGNOSIS — O09213 Supervision of pregnancy with history of pre-term labor, third trimester: Secondary | ICD-10-CM | POA: Diagnosis not present

## 2018-06-18 DIAGNOSIS — O289 Unspecified abnormal findings on antenatal screening of mother: Secondary | ICD-10-CM | POA: Diagnosis not present

## 2018-06-18 DIAGNOSIS — Z3A38 38 weeks gestation of pregnancy: Secondary | ICD-10-CM | POA: Diagnosis not present

## 2018-06-19 ENCOUNTER — Telehealth (HOSPITAL_COMMUNITY): Payer: Self-pay | Admitting: *Deleted

## 2018-06-19 ENCOUNTER — Ambulatory Visit (INDEPENDENT_AMBULATORY_CARE_PROVIDER_SITE_OTHER): Payer: BC Managed Care – PPO | Admitting: Obstetrics & Gynecology

## 2018-06-19 VITALS — BP 132/87 | HR 81 | Wt 206.1 lb

## 2018-06-19 DIAGNOSIS — O0993 Supervision of high risk pregnancy, unspecified, third trimester: Secondary | ICD-10-CM

## 2018-06-19 DIAGNOSIS — Z23 Encounter for immunization: Secondary | ICD-10-CM | POA: Diagnosis not present

## 2018-06-19 DIAGNOSIS — O24410 Gestational diabetes mellitus in pregnancy, diet controlled: Secondary | ICD-10-CM

## 2018-06-19 NOTE — Patient Instructions (Signed)
Labor Induction Labor induction is when steps are taken to cause a pregnant woman to begin the labor process. Most women go into labor on their own between 37 weeks and 42 weeks of the pregnancy. When this does not happen or when there is a medical need, methods may be used to induce labor. Labor induction causes a pregnant woman's uterus to contract. It also causes the cervix to soften (ripen), open (dilate), and thin out (efface). Usually, labor is not induced before 39 weeks of the pregnancy unless there is a problem with the baby or mother. Before inducing labor, your health care provider will consider a number of factors, including the following:  The medical condition of you and the baby.  How many weeks along you are.  The status of the baby's lung maturity.  The condition of the cervix.  The position of the baby. What are the reasons for labor induction? Labor may be induced for the following reasons:  The health of the baby or mother is at risk.  The pregnancy is overdue by 1 week or more.  The water breaks but labor does not start on its own.  The mother has a health condition or serious illness, such as high blood pressure, infection, placental abruption, or diabetes.  The amniotic fluid amounts are low around the baby.  The baby is distressed. Convenience or wanting the baby to be born on a certain date is not a reason for inducing labor. What methods are used for labor induction? Several methods of labor induction may be used, such as:  Prostaglandin medicine. This medicine causes the cervix to dilate and ripen. The medicine will also start contractions. It can be taken by mouth or by inserting a suppository into the vagina.  Inserting a thin tube (catheter) with a balloon on the end into the vagina to dilate the cervix. Once inserted, the balloon is expanded with water, which causes the cervix to open.  Stripping the membranes. Your health care provider separates  amniotic sac tissue from the cervix, causing the cervix to be stretched and causing the release of a hormone called progesterone. This may cause the uterus to contract. It is often done during an office visit. You will be sent home to wait for the contractions to begin. You will then come in for an induction.  Breaking the water. Your health care provider makes a hole in the amniotic sac using a small instrument. Once the amniotic sac breaks, contractions should begin. This may still take hours to see an effect.  Medicine to trigger or strengthen contractions. This medicine is given through an IV access tube inserted into a vein in your arm. All of the methods of induction, besides stripping the membranes, will be done in the hospital. Induction is done in the hospital so that you and the baby can be carefully monitored. How long does it take for labor to be induced? Some inductions can take up to 2-3 days. Depending on the cervix, it usually takes less time. It takes longer when you are induced early in the pregnancy or if this is your first pregnancy. If a mother is still pregnant and the induction has been going on for 2-3 days, either the mother will be sent home or a cesarean delivery will be needed. What are the risks associated with labor induction? Some of the risks of induction include:  Changes in fetal heart rate, such as too high, too low, or erratic.  Fetal distress.    Chance of infection for the mother and baby.  Increased chance of having a cesarean delivery.  Breaking off (abruption) of the placenta from the uterus (rare).  Uterine rupture (very rare). When induction is needed for medical reasons, the benefits of induction may outweigh the risks. What are some reasons for not inducing labor? Labor induction should not be done if:  It is shown that your baby does not tolerate labor.  You have had previous surgeries on your uterus, such as a myomectomy or the removal of  fibroids.  Your placenta lies very low in the uterus and blocks the opening of the cervix (placenta previa).  Your baby is not in a head-down position.  The umbilical cord drops down into the birth canal in front of the baby. This could cut off the baby's blood and oxygen supply.  You have had a previous cesarean delivery.  There are unusual circumstances, such as the baby being extremely premature. This information is not intended to replace advice given to you by your health care provider. Make sure you discuss any questions you have with your health care provider. Document Released: 02/14/2007 Document Revised: 03/02/2016 Document Reviewed: 04/24/2013 Elsevier Interactive Patient Education  2017 [employer]  

## 2018-06-19 NOTE — Progress Notes (Signed)
   PRENATAL VISIT NOTE  Subjective:  Leslie Cook is a 32 y.o. G2P0101 at [redacted]w[redacted]d being seen today for ongoing prenatal care.  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  Patient reports no leaking and occasional contractions.  Contractions: Irregular. Vag. Bleeding: None.  Movement: Present. Denies leaking of fluid.   The following portions of the patient's history were reviewed and updated as appropriate: allergies, current medications, past family history, past medical history, past social history, past surgical history and problem list. Problem list updated.  Objective:   Vitals:   06/19/18 0926  BP: 132/87  Pulse: 81  Weight: 206 lb 1.6 oz (93.5 kg)    Fetal Status: Fetal Heart Rate (bpm): 156   Movement: Present     General:  Alert, oriented and cooperative. Patient is in no acute distress.  Skin: Skin is warm and dry. No rash noted.   Cardiovascular: Normal heart rate noted  Respiratory: Normal respiratory effort, no problems with respiration noted  Abdomen: Soft, gravid, appropriate for gestational age.  Pain/Pressure: Present     Pelvic: Cervical exam deferred        Extremities: Normal range of motion.  Edema: Trace  Mental Status: Normal mood and affect. Normal behavior. Normal judgment and thought content.   Assessment and Plan:  Pregnancy: G2P0101 at [redacted]w[redacted]d  1. Supervision of high risk pregnancy in third trimester BP normal today  2. Diet controlled gestational diabetes mellitus (GDM) in third trimester FBS all normal and PP diet controlled  Term labor symptoms  and general obstetric precautions including but not limited to vaginal bleeding, contractions, leaking of fluid and fetal movement were reviewed in detail with the patient. Please refer to After Visit Summary for other counseling recommendations.  Return in about 1 week (around 06/26/2018) for afternoon for possible foley. IOL 40 weeks No future appointments.  Scheryl Darter, MD

## 2018-06-19 NOTE — Progress Notes (Signed)
Patient reports good fetal movement with some pressure.

## 2018-06-19 NOTE — Telephone Encounter (Signed)
Preadmission screen  

## 2018-06-26 ENCOUNTER — Other Ambulatory Visit: Payer: Self-pay

## 2018-06-26 ENCOUNTER — Inpatient Hospital Stay (HOSPITAL_COMMUNITY)
Admission: AD | Admit: 2018-06-26 | Discharge: 2018-06-28 | DRG: 807 | Disposition: A | Discharge status: Home / Self Care | Payer: BC Managed Care – PPO | Attending: Family Medicine | Admitting: Family Medicine

## 2018-06-26 ENCOUNTER — Encounter: Payer: BC Managed Care – PPO | Admitting: Obstetrics & Gynecology

## 2018-06-26 ENCOUNTER — Encounter (HOSPITAL_COMMUNITY): Payer: Self-pay | Admitting: *Deleted

## 2018-06-26 DIAGNOSIS — O09219 Supervision of pregnancy with history of pre-term labor, unspecified trimester: Secondary | ICD-10-CM

## 2018-06-26 DIAGNOSIS — O0993 Supervision of high risk pregnancy, unspecified, third trimester: Secondary | ICD-10-CM

## 2018-06-26 DIAGNOSIS — O24429 Gestational diabetes mellitus in childbirth, unspecified control: Secondary | ICD-10-CM | POA: Diagnosis not present

## 2018-06-26 DIAGNOSIS — Z8632 Personal history of gestational diabetes: Secondary | ICD-10-CM | POA: Diagnosis present

## 2018-06-26 DIAGNOSIS — F1290 Cannabis use, unspecified, uncomplicated: Secondary | ICD-10-CM | POA: Diagnosis present

## 2018-06-26 DIAGNOSIS — O99824 Streptococcus B carrier state complicating childbirth: Secondary | ICD-10-CM | POA: Diagnosis present

## 2018-06-26 DIAGNOSIS — Z23 Encounter for immunization: Secondary | ICD-10-CM

## 2018-06-26 DIAGNOSIS — O24420 Gestational diabetes mellitus in childbirth, diet controlled: Secondary | ICD-10-CM | POA: Diagnosis present

## 2018-06-26 DIAGNOSIS — O0990 Supervision of high risk pregnancy, unspecified, unspecified trimester: Secondary | ICD-10-CM

## 2018-06-26 DIAGNOSIS — O09899 Supervision of other high risk pregnancies, unspecified trimester: Secondary | ICD-10-CM

## 2018-06-26 DIAGNOSIS — O99820 Streptococcus B carrier state complicating pregnancy: Secondary | ICD-10-CM

## 2018-06-26 DIAGNOSIS — O24410 Gestational diabetes mellitus in pregnancy, diet controlled: Secondary | ICD-10-CM

## 2018-06-26 DIAGNOSIS — Z3A39 39 weeks gestation of pregnancy: Secondary | ICD-10-CM

## 2018-06-26 DIAGNOSIS — O479 False labor, unspecified: Secondary | ICD-10-CM | POA: Diagnosis present

## 2018-06-26 DIAGNOSIS — O99324 Drug use complicating childbirth: Secondary | ICD-10-CM | POA: Diagnosis present

## 2018-06-26 DIAGNOSIS — Z8759 Personal history of other complications of pregnancy, childbirth and the puerperium: Secondary | ICD-10-CM | POA: Diagnosis present

## 2018-06-26 DIAGNOSIS — Z87891 Personal history of nicotine dependence: Secondary | ICD-10-CM | POA: Diagnosis not present

## 2018-06-26 DIAGNOSIS — O139 Gestational [pregnancy-induced] hypertension without significant proteinuria, unspecified trimester: Secondary | ICD-10-CM | POA: Diagnosis present

## 2018-06-26 DIAGNOSIS — E559 Vitamin D deficiency, unspecified: Secondary | ICD-10-CM | POA: Diagnosis present

## 2018-06-26 DIAGNOSIS — O99321 Drug use complicating pregnancy, first trimester: Secondary | ICD-10-CM | POA: Diagnosis present

## 2018-06-26 DIAGNOSIS — O134 Gestational [pregnancy-induced] hypertension without significant proteinuria, complicating childbirth: Principal | ICD-10-CM | POA: Diagnosis present

## 2018-06-26 DIAGNOSIS — O24419 Gestational diabetes mellitus in pregnancy, unspecified control: Secondary | ICD-10-CM | POA: Diagnosis present

## 2018-06-26 LAB — CBC
HCT: 41.8 % (ref 36.0–46.0)
Hemoglobin: 14.1 g/dL (ref 12.0–15.0)
MCH: 31 pg (ref 26.0–34.0)
MCHC: 33.7 g/dL (ref 30.0–36.0)
MCV: 91.9 fL (ref 78.0–100.0)
PLATELETS: 297 10*3/uL (ref 150–400)
RBC: 4.55 MIL/uL (ref 3.87–5.11)
RDW: 13.8 % (ref 11.5–15.5)
WBC: 10.9 10*3/uL — ABNORMAL HIGH (ref 4.0–10.5)

## 2018-06-26 LAB — PROTEIN / CREATININE RATIO, URINE
Creatinine, Urine: 95 mg/dL
Protein Creatinine Ratio: 0.31 mg/mg{Cre} — ABNORMAL HIGH (ref 0.00–0.15)
Total Protein, Urine: 29 mg/dL

## 2018-06-26 LAB — TYPE AND SCREEN
ABO/RH(D): A POS
Antibody Screen: NEGATIVE

## 2018-06-26 LAB — COMPREHENSIVE METABOLIC PANEL
ALK PHOS: 155 U/L — AB (ref 38–126)
ALT: 14 U/L (ref 0–44)
AST: 20 U/L (ref 15–41)
Albumin: 3.2 g/dL — ABNORMAL LOW (ref 3.5–5.0)
Anion gap: 9 (ref 5–15)
BUN: 8 mg/dL (ref 6–20)
CALCIUM: 9 mg/dL (ref 8.9–10.3)
CO2: 17 mmol/L — ABNORMAL LOW (ref 22–32)
CREATININE: 0.71 mg/dL (ref 0.44–1.00)
Chloride: 103 mmol/L (ref 98–111)
Glucose, Bld: 103 mg/dL — ABNORMAL HIGH (ref 70–99)
Potassium: 4 mmol/L (ref 3.5–5.1)
Sodium: 129 mmol/L — ABNORMAL LOW (ref 135–145)
Total Bilirubin: 0.5 mg/dL (ref 0.3–1.2)
Total Protein: 7.1 g/dL (ref 6.5–8.1)

## 2018-06-26 LAB — GLUCOSE, CAPILLARY: Glucose-Capillary: 112 mg/dL — ABNORMAL HIGH (ref 70–99)

## 2018-06-26 LAB — RPR: RPR: NONREACTIVE

## 2018-06-26 MED ORDER — PRENATAL MULTIVITAMIN CH
1.0000 | ORAL_TABLET | Freq: Every day | ORAL | Status: DC
  Administered 2018-06-26 – 2018-06-27 (×2): 1 via ORAL
  Filled 2018-06-26 (×2): qty 1

## 2018-06-26 MED ORDER — ACETAMINOPHEN 325 MG PO TABS: 650.0000 mg | ORAL_TABLET | ORAL | Status: DC | PRN

## 2018-06-26 MED ORDER — ONDANSETRON HCL 4 MG/2ML IJ SOLN: 4.0000 mg | Freq: Four times a day (QID) | INTRAMUSCULAR | Status: DC | PRN

## 2018-06-26 MED ORDER — OXYCODONE-ACETAMINOPHEN 5-325 MG PO TABS: 2.0000 | ORAL_TABLET | ORAL | Status: DC | PRN

## 2018-06-26 MED ORDER — PENICILLIN G 3 MILLION UNITS IVPB - SIMPLE MED
3.0000 10*6.[IU] | INTRAVENOUS | Status: DC
  Filled 2018-06-26 (×2): qty 100

## 2018-06-26 MED ORDER — ONDANSETRON HCL 4 MG PO TABS: 4.0000 mg | ORAL_TABLET | ORAL | Status: DC | PRN

## 2018-06-26 MED ORDER — DIPHENHYDRAMINE HCL 25 MG PO CAPS: 25.0000 mg | ORAL_CAPSULE | Freq: Four times a day (QID) | ORAL | Status: DC | PRN

## 2018-06-26 MED ORDER — ONDANSETRON HCL 4 MG/2ML IJ SOLN: 4.0000 mg | INTRAMUSCULAR | Status: DC | PRN

## 2018-06-26 MED ORDER — IBUPROFEN 600 MG PO TABS
600.0000 mg | ORAL_TABLET | Freq: Four times a day (QID) | ORAL | Status: DC
  Administered 2018-06-26 – 2018-06-28 (×9): 600 mg via ORAL
  Filled 2018-06-26 (×9): qty 1

## 2018-06-26 MED ORDER — BENZOCAINE-MENTHOL 20-0.5 % EX AERO
1.0000 "application " | INHALATION_SPRAY | CUTANEOUS | Status: DC | PRN
  Administered 2018-06-26: 1 via TOPICAL
  Filled 2018-06-26: qty 56

## 2018-06-26 MED ORDER — INFLUENZA VAC SPLIT QUAD 0.5 ML IM SUSY
0.5000 mL | PREFILLED_SYRINGE | INTRAMUSCULAR | Status: AC
  Administered 2018-06-27: 0.5 mL via INTRAMUSCULAR
  Filled 2018-06-26: qty 0.5

## 2018-06-26 MED ORDER — TETANUS-DIPHTH-ACELL PERTUSSIS 5-2.5-18.5 LF-MCG/0.5 IM SUSP: 0.5000 mL | Freq: Once | INTRAMUSCULAR | Status: DC

## 2018-06-26 MED ORDER — ZOLPIDEM TARTRATE 5 MG PO TABS: 5.0000 mg | ORAL_TABLET | Freq: Every evening | ORAL | Status: DC | PRN

## 2018-06-26 MED ORDER — LACTATED RINGERS IV SOLN
INTRAVENOUS | Status: DC
  Administered 2018-06-26: 05:00:00 via INTRAVENOUS

## 2018-06-26 MED ORDER — OXYCODONE-ACETAMINOPHEN 5-325 MG PO TABS: 1.0000 | ORAL_TABLET | ORAL | Status: DC | PRN

## 2018-06-26 MED ORDER — OXYTOCIN BOLUS FROM INFUSION
500.0000 mL | Freq: Once | INTRAVENOUS | Status: AC
  Administered 2018-06-26: 500 mL via INTRAVENOUS

## 2018-06-26 MED ORDER — SOD CITRATE-CITRIC ACID 500-334 MG/5ML PO SOLN: 30.0000 mL | ORAL | Status: DC | PRN

## 2018-06-26 MED ORDER — SENNOSIDES-DOCUSATE SODIUM 8.6-50 MG PO TABS
2.0000 | ORAL_TABLET | ORAL | Status: DC
  Administered 2018-06-26 – 2018-06-28 (×2): 2 via ORAL
  Filled 2018-06-26 (×2): qty 2

## 2018-06-26 MED ORDER — COCONUT OIL OIL: 1.0000 "application " | TOPICAL_OIL | Status: DC | PRN

## 2018-06-26 MED ORDER — DIBUCAINE 1 % RE OINT: 1.0000 "application " | TOPICAL_OINTMENT | RECTAL | Status: DC | PRN

## 2018-06-26 MED ORDER — SIMETHICONE 80 MG PO CHEW: 80.0000 mg | CHEWABLE_TABLET | ORAL | Status: DC | PRN

## 2018-06-26 MED ORDER — WITCH HAZEL-GLYCERIN EX PADS: 1.0000 "application " | MEDICATED_PAD | CUTANEOUS | Status: DC | PRN

## 2018-06-26 MED ORDER — LIDOCAINE HCL (PF) 1 % IJ SOLN
30.0000 mL | INTRAMUSCULAR | Status: DC | PRN
  Filled 2018-06-26: qty 30

## 2018-06-26 MED ORDER — LACTATED RINGERS IV SOLN: 500.0000 mL | INTRAVENOUS | Status: DC | PRN

## 2018-06-26 MED ORDER — OXYTOCIN 40 UNITS IN LACTATED RINGERS INFUSION - SIMPLE MED
2.5000 [IU]/h | INTRAVENOUS | Status: DC
  Filled 2018-06-26: qty 1000

## 2018-06-26 MED ORDER — FENTANYL CITRATE (PF) 100 MCG/2ML IJ SOLN
100.0000 ug | INTRAMUSCULAR | Status: DC | PRN
  Administered 2018-06-26 (×2): 100 ug via INTRAVENOUS
  Filled 2018-06-26 (×2): qty 2

## 2018-06-26 MED ORDER — SODIUM CHLORIDE 0.9 % IV SOLN
5.0000 10*6.[IU] | Freq: Once | INTRAVENOUS | Status: AC
  Administered 2018-06-26: 5 10*6.[IU] via INTRAVENOUS
  Filled 2018-06-26: qty 5

## 2018-06-26 NOTE — Lactation Note (Signed)
This note was copied from a baby's chart. Lactation Consultation Note  Patient Name: Leslie Cook WUJWJ'XToday's Date: 06/26/2018 Reason for consult: Initial assessment   P2, Ex BF 10 mos. Mother latching baby in cross cradle upon entering.  Assisted  Baby has latched x 2.  Had mother prepump w manual pump on L side. Mother has latched baby more easily on R side.  Nipples evert and compressible. Reviewed hand expression.  Assisted w/ latching in both football and cross cradle. Baby latched off and on for approx 8 min and was sleepy. Suggest trying again when baby cues. Mom encouraged to feed baby 8-12 times/24 hours and with feeding cues.  Mom made aware of O/P services, breastfeeding support groups, community resources, and our phone # for post-discharge questions.     Maternal Data Has patient been taught Hand Expression?: Yes Does the patient have breastfeeding experience prior to this delivery?: Yes  Feeding Feeding Type: Breast Fed Length of feed: 8 min(off and on)  LATCH Score Latch: Repeated attempts needed to sustain latch, nipple held in mouth throughout feeding, stimulation needed to elicit sucking reflex.  Audible Swallowing: A few with stimulation  Type of Nipple: Everted at rest and after stimulation  Comfort (Breast/Nipple): Soft / non-tender  Hold (Positioning): Assistance needed to correctly position infant at breast and maintain latch.  LATCH Score: 7  Interventions Interventions: Breast feeding basics reviewed;Assisted with latch;Skin to skin;Hand express;Hand pump;Support pillows;Pre-pump if needed  Lactation Tools Discussed/Used Tools: Pump   Consult Status Consult Status: Follow-up Date: 06/27/18 Follow-up type: In-patient    Dahlia ByesBerkelhammer, Ruth Agcny East LLCBoschen 06/26/2018, 7:01 PM

## 2018-06-26 NOTE — MAU Note (Signed)
Pt felt baby move in triage.

## 2018-06-26 NOTE — H&P (Signed)
Leslie Cook is a 32 y.o. female presenting for painful uterine contractions for several hours. Denies leaking or bleeding  Patient Active Problem List   Diagnosis Date Noted  . Uterine contractions during pregnancy 06/26/2018  . Group B Streptococcus carrier, +RV culture, currently pregnant 06/07/2018  . Abnormal glucose tolerance test (GTT) during pregnancy, antepartum 05/02/2018  . Gestational diabetes 05/02/2018  . Vanishing twin syndrome 04/09/2018  . History of preterm delivery, currently pregnant 12/11/2017  . Vitamin D deficiency 12/10/2017  . Supervision of high-risk pregnancy 12/03/2017  . 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 Clinic [facility]  Prenatal Labs  Dating LMP Blood type: A/Positive/-- (02/25 1445)   Genetic Screen  AFP: neg    NIPS:low risk female  Antibody:Negative (02/25 1445)  Anatomic [place] Normal anatomy scan @17  wks; continue 17-P Rubella: 2.58 (02/25 1445)  GTT Early:               Third trimester: elevated RPR: Non Reactive (07/02 1041)   Flu vaccine 12/03/17 HBsAg: Negative (02/25 1445)   TDaP vaccine    06-19-18                                       HIV: Non Reactive (07/02 1041)  Baby Food Breast                                              [id number] (08/28 1356)Positive  Contraception Nexplanon? Pap: negative 12/03/17  Circumcision n/a   Pediatrician [facility] CF: Neg  Support Person Husband   Prenatal Classes No Hgb electrophoresis: normal     OB History    Gravida  2   Para  1   Term  0   Preterm  1   AB  0   Living  1     SAB  0   TAB  0   Ectopic  0   Multiple  0   Live Births  1          Past Medical History:  Diagnosis Date  . Meningitis 2009   Past Surgical History:  Procedure Laterality Date  . NO PAST SURGERIES     Family History: family history includes Diabetes in her father and maternal grandmother; Hypertension in her father and mother; Throat cancer in her father. Social  History:  reports that she quit smoking about 7 months ago. Her smoking use included cigarettes. She has never used smokeless tobacco. She reports that she has current or past drug history. Drug: Marijuana. She reports that she does not drink alcohol.     Maternal Diabetes: Yes:  Diabetes Type:  Diet controlled Genetic Screening: Normal Maternal Ultrasounds/Referrals: Normal Fetal Ultrasounds or other Referrals:  None Maternal Substance Abuse:  MJ in first trimester Significant Maternal Medications:  None Significant Maternal Lab Results:  Lab values include: Group B Strep positive Other Comments:  Vanishing twin  Review of Systems  Constitutional: Negative for chills, fever and malaise/fatigue.  Respiratory: Negative for shortness of breath.   Cardiovascular: Negative for leg swelling.  Gastrointestinal: Positive for abdominal pain. Negative for constipation, diarrhea, nausea and vomiting.  Neurological: Negative for dizziness and focal weakness.   Maternal Medical History:  Reason for admission: Contractions.  Nausea.  Contractions: Onset  was 3-5 hours ago.   Frequency: irregular.    Fetal activity: Perceived fetal activity is decreased.   Last perceived fetal movement was within the past hour.    Prenatal complications: PIH.   No bleeding.   Prenatal Complications - Diabetes: gestational. Diabetes is managed by diet.      Dilation: 4.5 Effacement (%): 80 Station: -2 Exam by:: Lauren Cox RN  Blood pressure (!) 153/97, pulse 91, temperature 99 F (37.2 C), temperature source Oral, resp. rate 20, height 5\' 2"  (1.575 m), weight 93.8 kg, last menstrual period 09/08/2017, currently breastfeeding. Maternal Exam:  Uterine Assessment: Contraction strength is moderate.  Contraction frequency is irregular.   Abdomen: Patient reports no abdominal tenderness. Fetal presentation: vertex  Introitus: Normal vulva. Normal vagina.  Pelvis: adequate for delivery.   Cervix: Cervix  evaluated by digital exam.     Fetal Exam Fetal Monitor Review: Mode: ultrasound.   Baseline rate: 135.  Variability: moderate (6-25 bpm).   Pattern: accelerations present and no decelerations.    Fetal State Assessment: Category I - tracings are normal.     Physical Exam  Constitutional: She is oriented to person, place, and time. She appears well-developed and well-nourished. No distress.  HENT:  Head: Normocephalic.  Cardiovascular: Normal rate, regular rhythm and normal heart sounds.  Respiratory: No respiratory distress.  GI: Soft. She exhibits no distension. There is no tenderness. There is no rebound and no guarding.  Genitourinary:  Genitourinary Comments: Dilation: 4.5 Effacement (%): 80 Station: -2 Presentation: Vertex Exam by:: Ralene MuskratLauren Cox RN    Musculoskeletal: Normal range of motion.  Neurological: She is alert and oriented to person, place, and time.  Skin: Skin is warm and dry.  Psychiatric: She has a normal mood and affect.    Prenatal labs: ABO, Rh: A/Positive/-- (02/25 1445) Antibody: Negative (02/25 1445) Rubella: 2.58 (02/25 1445) RPR: Non Reactive (07/02 1041)  HBsAg: Negative (02/25 1445)  HIV: Non Reactive (07/02 1041)  GBS: Positive (08/28 1356)   Assessment/Plan: Single intrauterine pregnancy at 425w6d Labor History of preterm delivery, now term Gestational Diabetes  [facility] Routine orders Penicillin prophylaxis Anticipate SVD   Wynelle BourgeoisMarie Rheagan Nayak 06/26/2018, 5:00 AM

## 2018-06-26 NOTE — MAU Note (Signed)
Pt reports to MAU c/o ctx every 5-6 min. Pt denies bleeding or LOF. +FM 1hour ago but has been decreased today.

## 2018-06-27 LAB — BIRTH TISSUE RECOVERY COLLECTION (PLACENTA DONATION)

## 2018-06-27 MED ORDER — NIFEDIPINE ER OSMOTIC RELEASE 30 MG PO TB24
30.0000 mg | ORAL_TABLET | Freq: Every day | ORAL | Status: DC
  Administered 2018-06-27 – 2018-06-28 (×2): 30 mg via ORAL
  Filled 2018-06-27 (×2): qty 1

## 2018-06-27 NOTE — Progress Notes (Signed)
CSW received consult for hx of marijuana use.  Referral was screened out due to the following: ~MOB had no documented substance use after initial prenatal visit/+UPT. ~MOB had no positive drug screens after initial prenatal visit/+UPT.  Please consult CSW if current concerns arise or by MOB's request.  CSW will monitor UDS and CDS results and make report to Child Protective Services if warranted.  Kiva Norland Boyd-Gilyard, MSW, LCSW Clinical Social Work [phone]   

## 2018-06-27 NOTE — Progress Notes (Addendum)
Post Partum Day #1 Subjective: Pt is a 32 yo G2P1102 36w6ds/p SVD following SOL. Pregnancy complicated by gHTN and AE5UDJ She is doing well today, no complaints or significant overnight events. Pain is well controlled. Lochia small. Ambulating well, tolerating PO intake, urinating normally, +flatus, no BM yet. Denies any nausea, vomiting, dizziness, HA, blurred vision, or RUQ pain. She is breastfeeding, requests Nexplanon for contraception.   Objective: Blood pressure 132/88, pulse 80, temperature 98.4 F (36.9 C), temperature source Oral, resp. rate 16, height 5' 2"  (1.575 m), weight 93.8 kg, last menstrual period 09/08/2017, SpO2 99 %, unknown if currently breastfeeding.  Physical Exam:  General: alert, cooperative, appears stated age and no distress Lochia: appropriate Uterine Fundus: firm Incision: N/A DVT Evaluation: No evidence of DVT seen on physical exam.  Recent Labs    06/26/18 0434  HGB 14.1  HCT 41.8    Assessment/Plan: Pt had several borderline BP readings overnight. Start nifedipine and continue to monitor. Continue to monitor CBG. Plan for discharge tomorrow, enrolled in [place] MMR prior to discharge (rubella NI status). Breastfeeding. F/u OP for Nexplanon insertion for contraception.    LOS: 1 day   Niomie Englert 06/27/2018, 10:12 AM

## 2018-06-27 NOTE — Lactation Note (Signed)
This note was copied from a baby's chart. Lactation Consultation Note  Patient Name: Leslie Cook ZOXWR'UToday's Date: 06/27/2018 Reason for consult: Follow-up assessment;Mother's request;Difficult latch;Term P2, 37 hrs female infant, currently low voids and stools per age, supplementing w/ formula, Mom w/ hx of GDM in pregnancy. Per mom, not been using DEBP as advised by previous LC. LC assisted w/ hand expression and mom expressed 2 ml of colostrum and colostrum was given in NS with infant latched to breast. Mom was please that infant was BF because she was having difficulties getting infant to latch to her breast or sustain latch. Infant latched to mom's left breast using cross-cradle position w/ NS pre-filled EBM and formula. Infant sustained latch and audible swallows [facility] by [facility] and parents. Infant feeding 10 mins. at breast and was still feeding as LC left room. LC demonstrated to dad how to pre-fill NS w/ curve tip syringe and dad is pre-filling mom's NS with  EBM and / formula to help infant learn how to suckle and sustain latch at breast.   Mom's short term goals: 1. Will hand express and use EBM to pre-fill NS to  help infant sustain  latch at  Breast. Mom will BF according cues or 8 to 12 times within 24 hours. 2. Will supplement w/ formula based on infant age and hours of birth until mom's milk volume increases. 3. Mom's is now committed to pumping every 3 hours after breastfeeding to help with breast milk induction and stimulation.  4. Mom will give back EBM pump and as BM volume increases give less formula.  Maternal Data    Feeding Feeding Type: Formula Length of feed: 10 min(Mom still feeding as LC left room)  LATCH Score Latch: Grasps breast easily, tongue down, lips flanged, rhythmical sucking.  Audible Swallowing: Spontaneous and intermittent  Type of Nipple: Inverted(right breast only)  Comfort (Breast/Nipple): Soft / non-tender  Hold (Positioning): Assistance  needed to correctly position infant at breast and maintain latch.  LATCH Score: 7  Interventions Interventions: Assisted with latch;Hand express;Adjust position;Support pillows;Breast massage  Lactation Tools Discussed/Used Tools: Pump;Other (comment)(nipple shield #20) Nipple shield size: 20   Consult Status Consult Status: Follow-up Date: 06/28/18 Follow-up type: In-patient    [name] 06/27/2018, 9:48 PM

## 2018-06-28 MED ORDER — IBUPROFEN 600 MG PO TABS: 600.0000 mg | ORAL_TABLET | Freq: Four times a day (QID) | ORAL | 0 refills | Status: DC

## 2018-06-28 MED ORDER — NIFEDIPINE ER 30 MG PO TB24: 30.0000 mg | ORAL_TABLET | Freq: Every day | ORAL | 0 refills | Status: DC

## 2018-06-28 MED ORDER — ACETAMINOPHEN 325 MG PO TABS: 650.0000 mg | ORAL_TABLET | ORAL | 0 refills | Status: DC | PRN

## 2018-06-28 NOTE — Discharge Summary (Addendum)
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 yo [id number] admitted in Latent Labor on 06/26/2018. Patient had an uncomplicated labor course as follows:  Membrane Rupture Time/Date: 8:05 AM ,06/26/2018   Intrapartum Procedures: Episiotomy: None [1]                                         Lacerations:  None [1]  Patient had a delivery of a Viable infant. 06/26/2018  Information for the patient's newborn:  DeSimra, Fiebigirl ToOliva0[[id number]]Delivery Method: Vaginal, Spontaneous(Filed from Delivery Summary)    Pateint had an uncomplicated postpartum course.  She is ambulating, tolerating a regular diet, passing flatus, and urinating well. Patient is discharged home in stable condition on 06/28/18.   Physical exam  Vitals:   06/27/18 1325 06/27/18 1601 06/27/18 2319 06/28/18 0550  BP: 138/87 (!) 133/92 125/80 114/63  Pulse: 78 73 (!) 103 74  Resp: 18 18 18 16   Temp:  98.8 F (37.1 C) 98.8 F (37.1 C) 98.3 F (36.8 C)  TempSrc:  Oral Oral Oral  SpO2:  100%    Weight:      Height:        General: alert, cooperative and no distress Lochia: appropriate Uterine Fundus: firm Incision: N/A DVT Evaluation: No evidence of DVT seen on physical exam. Labs: Lab Results  Component Value Date   WBC 10.9 (H) 06/26/2018   HGB 14.1 06/26/2018   HCT 41.8 06/26/2018   MCV 91.9 06/26/2018   PLT 297 06/26/2018   CMP Latest Ref Rng & Units 06/26/2018  Glucose 70 - 99 mg/dL 103(H)  BUN 6 - 20 mg/dL 8  Creatinine 0.44 - 1.00 mg/dL 0.71  Sodium 135 - 145 mmol/L 129(L)  Potassium 3.5 - 5.1 mmol/L 4.0  Chloride 98 - 111 mmol/L 103  CO2 22 - 32 mmol/L 17(L)  Calcium 8.9 - 10.3 mg/dL 9.0  Total Protein 6.5 - 8.1 g/dL 7.1  Total Bilirubin 0.3 - 1.2 mg/dL 0.5  Alkaline Phos 38 - 126 U/L  155(H)  AST 15 - 41 U/L 20  ALT 0 - 44 U/L 14    Discharge instruction: per After Visit Summary and "Baby and Me Booklet".  After visit meds:  Allergies as of 06/28/2018   No Known Allergies     Medication List    STOP taking these medications   ACCU-CHEK AVIVA PLUS w/Device Kit   ACCU-CHEK FASTCLIX LANCETS Misc   ACCU-CHEK SOFTCLIX LANCETS lancets   glucose blood test strip     TAKE these medications   acetaminophen 325 MG tablet Commonly known as:  TYLENOL Take 2 tablets (650 mg total) by mouth every 4 (four) hours as needed (for pain scale < 4).   ibuprofen 600 MG tablet Commonly known as:  ADVIL,MOTRIN Take 1 tablet (600 mg total) by mouth every 6 (six) hours.   NIFEdipine 30 MG 24 hr tablet Commonly known as:  PROCARDIA-XL/ADALAT CC Take 1 tablet (30 mg total) by mouth daily.   PRENATAL VITAMIN PO Take by mouth.       Diet: routine diet  Activity: Advance as tolerated. Pelvic rest for 6 weeks.   Outpatient follow up:6 weeks Follow up Appt: Future Appointments  Date Time Provider [place]  08/05/2018  8:15 AM [facility] None  08/05/2018  8:30 AM Constant, Peggy, MD [facility] None   Follow up Visit:No follow-ups on file.  Postpartum  contraception: Nexplanon  Newborn Data: Live born female  Birth Weight: 7 lb 0.9 oz (3200 g) APGAR: 8, 9  Newborn Delivery   Birth date/time:  06/26/2018 08:10:00 Delivery type:  Vaginal, Spontaneous     Baby Feeding: Breast Disposition:home with mother   06/28/2018 Matilde Haymaker, MD   OB FELLOW DISCHARGE ATTESTATION  I have seen and examined this patient. I agree with above documentation and have made edits as needed.   Genice Kimberlin L, MD OB Fellow 10:03 AM

## 2018-06-28 NOTE — Lactation Note (Signed)
This note was copied from a baby's chart. Lactation Consultation Note  Patient Name: Leslie Cook [id number]'XToday's Date: 06/28/2018 Reason for consult: Follow-up assessment;Difficult latch;Other (Comment);Term(no void since birth) Mom has been prefilling nipple shield with colostrum and then finger feeding formula to baby.  She is pumping every 3 hours and last obtained 6 mls.  Baby is stooling well but no void at 51 hours despite formula supplementation.  Mom attempting to latch baby to breast in cross cradle hold.  Latch is shallow and cheeks dimpling.  Assisted with positioning baby in football hold with 5 french feeding tube/syringe.  Baby was able to obtain more depth and no dimpling with feed.  Baby took 20 mls of formula at breast and came off content.  Instructed to feed with cues and attempt first without shield, supplement with 20-30 mls of formula/breastmilk using 5 french/syringe, post pump every 3 hours and call for assist prn.  Maternal Data    Feeding Feeding Type: Formula Length of feed: 15 min  LATCH Score Latch: Grasps breast easily, tongue down, lips flanged, rhythmical sucking.(with 20 mm nipple shield)  Audible Swallowing: Spontaneous and intermittent  Type of Nipple: Everted at rest and after stimulation  Comfort (Breast/Nipple): Soft / non-tender  Hold (Positioning): Assistance needed to correctly position infant at breast and maintain latch.  LATCH Score: 9  Interventions Interventions: Assisted with latch;Breast compression;Skin to skin;Adjust position;Breast massage;Support pillows;DEBP  Lactation Tools Discussed/Used Tools: 58F feeding tube / Syringe;Nipple Shields Nipple shield size: 20   Consult Status Consult Status: Follow-up Date: 06/29/18 Follow-up type: In-patient    Huston FoleyMOULDEN, Wyett Narine S 06/28/2018, 11:33 AM

## 2018-06-28 NOTE — Progress Notes (Signed)
Babyscripts My Journey app and BP monitor successfully established with Patient. Stated she had no further questions.

## 2018-06-28 NOTE — Discharge Instructions (Signed)
Vaginal Delivery, Care After °Refer to this sheet in the next few weeks. These instructions provide you with information about caring for yourself after vaginal delivery. Your health care provider may also give you more specific instructions. Your treatment has been planned according to current medical practices, but problems sometimes occur. Call your health care provider if you have any problems or questions. °What can I expect after the procedure? °After vaginal delivery, it is common to have: °· Some bleeding from your vagina. °· Soreness in your abdomen, your vagina, and the area of skin between your vaginal opening and your anus (perineum). °· Pelvic cramps. °· Fatigue. ° °Follow these instructions at home: °Medicines °· Take over-the-counter and prescription medicines only as told by your health care provider. °· If you were prescribed an antibiotic medicine, take it as told by your health care provider. Do not stop taking the antibiotic until it is finished. °Driving ° °· Do not drive or operate heavy machinery while taking prescription pain medicine. °· Do not drive for 24 hours if you received a sedative. °Lifestyle °· Do not drink alcohol. This is especially important if you are breastfeeding or taking medicine to relieve pain. °· Do not use tobacco products, including cigarettes, chewing tobacco, or e-cigarettes. If you need help quitting, ask your health care provider. °Eating and drinking °· Drink at least 8 eight-ounce glasses of water every day unless you are told not to by your health care provider. If you choose to breastfeed your baby, you may need to drink more water than this. °· Eat high-fiber foods every day. These foods may help prevent or relieve constipation. High-fiber foods include: °? Whole grain cereals and breads. °? Brown rice. °? Beans. °? Fresh fruits and vegetables. °Activity °· Return to your normal activities as told by your health care provider. Ask your health care provider  what activities are safe for you. °· Rest as much as possible. Try to rest or take a nap when your baby is sleeping. °· Do not lift anything that is heavier than your baby or 10 lb (4.5 kg) until your health care provider says that it is safe. °· Talk with your health care provider about when you can engage in sexual activity. This may depend on your: °? Risk of infection. °? Rate of healing. °? Comfort and desire to engage in sexual activity. °Vaginal Care °· If you have an episiotomy or a vaginal tear, check the area every day for signs of infection. Check for: °? More redness, swelling, or pain. °? More fluid or blood. °? Warmth. °? Pus or a bad smell. °· Do not use tampons or douches until your health care provider says this is safe. °· Watch for any blood clots that may pass from your vagina. These may look like clumps of dark red, brown, or black discharge. °General instructions °· Keep your perineum clean and dry as told by your health care provider. °· Wear loose, comfortable clothing. °· Wipe from front to back when you use the toilet. °· Ask your health care provider if you can shower or take a bath. If you had an episiotomy or a perineal tear during labor and delivery, your health care provider may tell you not to take baths for a certain length of time. °· Wear a bra that supports your breasts and fits you well. °· If possible, have someone help you with household activities and help care for your baby for at least a few days after   you leave the hospital. °· Keep all follow-up visits for you and your baby as told by your health care provider. This is important. °Contact a health care provider if: °· You have: °? Vaginal discharge that has a bad smell. °? Difficulty urinating. °? Pain when urinating. °? A sudden increase or decrease in the frequency of your bowel movements. °? More redness, swelling, or pain around your episiotomy or vaginal tear. °? More fluid or blood coming from your episiotomy or  vaginal tear. °? Pus or a bad smell coming from your episiotomy or vaginal tear. °? A fever. °? A rash. °? Little or no interest in activities you used to enjoy. °? Questions about caring for yourself or your baby. °· Your episiotomy or vaginal tear feels warm to the touch. °· Your episiotomy or vaginal tear is separating or does not appear to be healing. °· Your breasts are painful, hard, or turn red. °· You feel unusually sad or worried. °· You feel nauseous or you vomit. °· You pass large blood clots from your vagina. If you pass a blood clot from your vagina, save it to show to your health care provider. Do not flush blood clots down the toilet without having your health care provider look at them. °· You urinate more than usual. °· You are dizzy or light-headed. °· You have not breastfed at all and you have not had a menstrual period for 12 weeks after delivery. °· You have stopped breastfeeding and you have not had a menstrual period for 12 weeks after you stopped breastfeeding. °Get help right away if: °· You have: °? Pain that does not go away or does not get better with medicine. °? Chest pain. °? Difficulty breathing. °? Blurred vision or spots in your vision. °? Thoughts about hurting yourself or your baby. °· You develop pain in your abdomen or in one of your legs. °· You develop a severe headache. °· You faint. °· You bleed from your vagina so much that you fill two sanitary pads in one hour. °This information is not intended to replace advice given to you by your health care provider. Make sure you discuss any questions you have with your health care provider. °Document Released: 09/22/2000 Document Revised: 03/08/2016 Document Reviewed: 10/10/2015 °Elsevier Interactive Patient Education © 2018 [employer] ° °

## 2018-06-29 ENCOUNTER — Ambulatory Visit: Payer: Self-pay

## 2018-06-29 ENCOUNTER — Inpatient Hospital Stay (HOSPITAL_COMMUNITY): Admission: RE | Admit: 2018-06-29 | Payer: BC Managed Care – PPO | Source: Ambulatory Visit

## 2018-06-29 NOTE — Lactation Note (Signed)
This note was copied from a baby's chart. Lactation Consultation Note  Patient Name: Leslie Cook Date: 06/29/2018 Reason for consult: Follow-up assessment Milk is in and breasts are mildly engorged.  Ice packs given to patient.  She states baby is latching well using the nipple shield.  Mom is pumping 20-30 mls per breast of transitional milk.  Lactation outpatient services and support reviewed and encouraged prn.  Maternal Data    Feeding Feeding Type: Breast Milk Length of feed: 20 min  LATCH Score Latch: Grasps breast easily, tongue down, lips flanged, rhythmical sucking.  Audible Swallowing: Spontaneous and intermittent  Type of Nipple: Everted at rest and after stimulation  Comfort (Breast/Nipple): Soft / non-tender  Hold (Positioning): No assistance needed to correctly position infant at breast.  LATCH Score: 10  Interventions    Lactation Tools Discussed/Used     Consult Status Consult Status: Complete Follow-up type: Call as needed    Huston FoleyMOULDEN, Roel Douthat S 06/29/2018, 11:06 AM

## 2018-07-05 ENCOUNTER — Ambulatory Visit: Payer: BC Managed Care – PPO | Admitting: Family Medicine

## 2018-07-05 ENCOUNTER — Encounter (HOSPITAL_COMMUNITY): Payer: Self-pay | Admitting: *Deleted

## 2018-07-05 ENCOUNTER — Encounter: Payer: Self-pay | Admitting: Family Medicine

## 2018-07-05 ENCOUNTER — Inpatient Hospital Stay (HOSPITAL_COMMUNITY)
Admission: AD | Admit: 2018-07-05 | Discharge: 2018-07-05 | Disposition: A | Discharge status: Home / Self Care | Payer: BC Managed Care – PPO | Source: Ambulatory Visit | Attending: Obstetrics & Gynecology | Admitting: Obstetrics & Gynecology

## 2018-07-05 VITALS — BP 118/86 | HR 76 | Temp 98.5°F

## 2018-07-05 DIAGNOSIS — O135 Gestational [pregnancy-induced] hypertension without significant proteinuria, complicating the puerperium: Secondary | ICD-10-CM | POA: Insufficient documentation

## 2018-07-05 DIAGNOSIS — Z8249 Family history of ischemic heart disease and other diseases of the circulatory system: Secondary | ICD-10-CM | POA: Diagnosis not present

## 2018-07-05 DIAGNOSIS — O1495 Unspecified pre-eclampsia, complicating the puerperium: Secondary | ICD-10-CM

## 2018-07-05 DIAGNOSIS — Z8632 Personal history of gestational diabetes: Secondary | ICD-10-CM | POA: Insufficient documentation

## 2018-07-05 DIAGNOSIS — I10 Essential (primary) hypertension: Secondary | ICD-10-CM | POA: Diagnosis present

## 2018-07-05 DIAGNOSIS — Z79899 Other long term (current) drug therapy: Secondary | ICD-10-CM | POA: Diagnosis not present

## 2018-07-05 DIAGNOSIS — Z87891 Personal history of nicotine dependence: Secondary | ICD-10-CM | POA: Diagnosis not present

## 2018-07-05 DIAGNOSIS — Z8661 Personal history of infections of the central nervous system: Secondary | ICD-10-CM | POA: Diagnosis not present

## 2018-07-05 DIAGNOSIS — O139 Gestational [pregnancy-induced] hypertension without significant proteinuria, unspecified trimester: Secondary | ICD-10-CM

## 2018-07-05 DIAGNOSIS — Z833 Family history of diabetes mellitus: Secondary | ICD-10-CM | POA: Diagnosis not present

## 2018-07-05 HISTORY — DX: Gestational diabetes mellitus in pregnancy, unspecified control: O24.419

## 2018-07-05 LAB — COMPREHENSIVE METABOLIC PANEL
ALT: 51 U/L — ABNORMAL HIGH (ref 0–44)
AST: 51 U/L — ABNORMAL HIGH (ref 15–41)
Albumin: 3.4 g/dL — ABNORMAL LOW (ref 3.5–5.0)
Alkaline Phosphatase: 91 U/L (ref 38–126)
Anion gap: 9 (ref 5–15)
BUN: 17 mg/dL (ref 6–20)
CO2: 28 mmol/L (ref 22–32)
Calcium: 9.5 mg/dL (ref 8.9–10.3)
Chloride: 102 mmol/L (ref 98–111)
Creatinine, Ser: 0.97 mg/dL (ref 0.44–1.00)
GFR calc Af Amer: >60 mL/min (ref >60)
GFR calc non Af Amer: >60 mL/min (ref >60)
Glucose, Bld: 98 mg/dL (ref 70–99)
Potassium: 4.4 mmol/L (ref 3.5–5.1)
Sodium: 139 mmol/L (ref 135–145)
Total Bilirubin: 0.4 mg/dL (ref 0.3–1.2)
Total Protein: 7 g/dL (ref 6.5–8.1)

## 2018-07-05 LAB — CBC
HEMATOCRIT: 39.3 % (ref 36.0–46.0)
HEMOGLOBIN: 13.1 g/dL (ref 12.0–15.0)
MCH: 30.9 pg (ref 26.0–34.0)
MCHC: 33.3 g/dL (ref 30.0–36.0)
MCV: 92.7 fL (ref 78.0–100.0)
Platelets: 327 10*3/uL (ref 150–400)
RBC: 4.24 MIL/uL (ref 3.87–5.11)
RDW: 13.6 % (ref 11.5–15.5)
WBC: 9.4 10*3/uL (ref 4.0–10.5)

## 2018-07-05 LAB — POC URINALSYSI DIPSTICK (AUTOMATED)
Bilirubin, UA: NEGATIVE
Glucose, UA: NEGATIVE
Ketones, UA: NEGATIVE
Nitrite, UA: NEGATIVE
Protein, UA: POSITIVE — AB
Spec Grav, UA: >=1.03 — AB (ref 1.010–1.025)
Urobilinogen, UA: 1 E.U./dL
pH, UA: 5.5 (ref 5.0–8.0)

## 2018-07-05 LAB — PROTEIN / CREATININE RATIO, URINE
CREATININE, URINE: 185 mg/dL
Protein Creatinine Ratio: 0.04 mg/mg{Cre} (ref 0.00–0.15)
Total Protein, Urine: 8 mg/dL

## 2018-07-05 MED ORDER — NIFEDIPINE ER OSMOTIC RELEASE 60 MG PO TB24: 60.0000 mg | ORAL_TABLET | Freq: Every day | ORAL | 0 refills | Status: DC

## 2018-07-05 MED ORDER — HYDROCHLOROTHIAZIDE 25 MG PO TABS: 25.0000 mg | ORAL_TABLET | Freq: Every day | ORAL | 0 refills | Status: DC

## 2018-07-05 NOTE — Progress Notes (Signed)
Baby Scripts data

## 2018-07-05 NOTE — MAU Provider Note (Signed)
History     CSN: [id number]  Arrival date and time: 07/05/18 1657   First Provider Initiated Contact with Patient 07/05/18 1746      Chief Complaint  Patient presents with  . Hypertension   Leslie Cook is a 32 y.o. [id number] who is 9 days s/p NSVD. Pregnancy was complicated by GDM and GHTN. She was started on procardia 30XL q day while inpatient. She was DC home on baby scripts app. She has been tracking BP at home. Last night she had a bp 133/103, and she rechecked in several times. She also reports a headache yesterday, left unilateral 7/10. Took ibuprofen, and took a nap. Headache was better when she woke up. She had another headache this morning, this resolved on its own. She denies any headache at this time. She is having cramping pain 3/10. She went to her PCP for evaluation. Blood pressure in her PCP office was normal. PCP noted that she had proteinuria, and her PCP advised her to come for evaluation. Of note, Dr. Shawnie Pons had reviewed her blood pressure readings in baby scripts, and messaged the patient to increase her procardia to 60mg XL QD.    OB History    Gravida  2   Para  2   Term  1   Preterm  1   AB  0   Living  2     SAB  0   TAB  0   Ectopic  0   Multiple  0   Live Births  2           Past Medical History:  Diagnosis Date  . Gestational diabetes   . Meningitis 2009    Past Surgical History:  Procedure Laterality Date  . NO PAST SURGERIES      Family History  Problem Relation Age of Onset  . Hypertension Mother   . Hypertension Father   . Diabetes Father   . Throat cancer Father   . Diabetes Maternal Grandmother     Social History   Tobacco Use  . Smoking status: Former Smoker    Types: Cigarettes    Last attempt to quit: 11/11/2017    Years since quitting: 0.6  . Smokeless tobacco: Never Used  Substance Use Topics  . Alcohol use: No  . Drug use: Not Currently    Types: Marijuana    Comment: last smoked 11/21/17    Allergies:  No Known Allergies  Medications Prior to Admission  Medication Sig Dispense Refill Last Dose  . ibuprofen (ADVIL,MOTRIN) 600 MG tablet Take 1 tablet (600 mg total) by mouth every 6 (six) hours. 60 tablet 0 07/05/2018 at 1000  . Prenatal Vit-Fe Fumarate-FA (PRENATAL VITAMIN PO) Take by mouth.   07/05/2018 at 1000  . NIFEdipine (PROCARDIA-XL/ADALAT CC) 30 MG 24 hr tablet Take 1 tablet (30 mg total) by mouth daily. 30 tablet 0 07/05/2018 at 1000    Review of Systems  Constitutional: Negative for chills and fever.  Eyes: Negative for visual disturbance.  Gastrointestinal: Negative for nausea and vomiting.  Genitourinary: Positive for vaginal bleeding. Negative for dysuria, frequency and urgency.  Neurological: Negative for headaches.   Physical Exam   Blood pressure 116/74, pulse 77, temperature 99 F (37.2 C), temperature source Oral, resp. rate 17, height 5\' 2"  (1.575 m), weight 87.1 kg, SpO2 100 %, unknown if currently breastfeeding.  Physical Exam  Nursing note and vitals reviewed. Constitutional: She is oriented to person, place, and time. She appears well-developed and well-nourished. No  distress.  HENT:  Head: Normocephalic.  Cardiovascular: Normal rate.  Respiratory: Effort normal.  GI: Soft.  Neurological: She is alert and oriented to person, place, and time.  Skin: Skin is warm and dry.  Psychiatric: She has a normal mood and affect.   Results for orders placed or performed during the hospital encounter of 07/05/18 (from the past 24 hour(s))  CBC     Status: None   Collection Time: 07/05/18  6:11 PM  Result Value Ref Range   WBC 9.4 4.0 - 10.5 K/uL   RBC 4.24 3.87 - 5.11 MIL/uL   Hemoglobin 13.1 12.0 - 15.0 g/dL   HCT 16.1 09.6 - 04.5 %   MCV 92.7 78.0 - 100.0 fL   MCH 30.9 26.0 - 34.0 pg   MCHC 33.3 30.0 - 36.0 g/dL   RDW 40.9 81.1 - 91.4 %   Platelets 327 150 - 400 K/uL  Comprehensive metabolic panel     Status: Abnormal   Collection Time: 07/05/18  6:11 PM   Result Value Ref Range   Sodium 139 135 - 145 mmol/L   Potassium 4.4 3.5 - 5.1 mmol/L   Chloride 102 98 - 111 mmol/L   CO2 28 22 - 32 mmol/L   Glucose, Bld 98 70 - 99 mg/dL   BUN 17 6 - 20 mg/dL   Creatinine, Ser 7.82 0.44 - 1.00 mg/dL   Calcium 9.5 8.9 - 95.6 mg/dL   Total Protein 7.0 6.5 - 8.1 g/dL   Albumin 3.4 (L) 3.5 - 5.0 g/dL   AST 51 (H) 15 - 41 U/L   ALT 51 (H) 0 - 44 U/L   Alkaline Phosphatase 91 38 - 126 U/L   Total Bilirubin 0.4 0.3 - 1.2 mg/dL   GFR calc non Af Amer >60 >60 mL/min   GFR calc Af Amer >60 >60 mL/min   Anion gap 9 5 - 15  Protein / creatinine ratio, urine     Status: None   Collection Time: 07/05/18  6:30 PM  Result Value Ref Range   Creatinine, Urine 185.00 mg/dL   Total Protein, Urine 8 mg/dL   Protein Creatinine Ratio 0.04 0.00 - 0.15 mg/mg[Cre]    MAU Course  Procedures  MDM 7:31 PM Consult with Dr. Despina Hidden re: presenting complaint, current blood pressure checks and lab results. He is ok with DC home. Plan to increase procardia as planned. Either 30mg  BID or 60qd, and give a short course of 25mg  HCTZ as well. Patient to continue checking blood pressure with baby scripts.   Assessment and Plan   1. Gestational hypertension, antepartum   2. Postpartum care and examination    DC home Comfort measures reviewed  Pre-eclampsia warning signs  RX: procardia 60mg  XL q day, HCTZ 25mg  X 2 days  Return to MAU as needed Message sent to [facility] for patient to be seen in one week Continue baby scripts   Follow-up Information    [facility] Follow up.   Contact information: [address] [place] [address] [phone]           Thressa Sheller 07/05/2018, 7:31 PM

## 2018-07-05 NOTE — MAU Note (Signed)
Pt reports she was seen in office today due to elevated b/p, pt is s/p SVD on 09/18. Headache earlier today and yesterday. Protein in her urine in office today.

## 2018-07-05 NOTE — Progress Notes (Signed)
Subjective:    Patient ID: Leslie Cook, female    DOB: [birth year], 32 y.o.   MRN: [id number]  No chief complaint on file. Pt accompanied by her husband, son, and newborn daughter.  HPI Patient was seen today for acute concern.  Pt 9 days postpartum, endorses elevated BP readings over the last few days and HA.  BP 133/103, 133/93.  Pt notes after delivery was given BP cuff and started on Procardia XL 30 mg daily on discharge from hospital.   Pt endorses pedal edema yesterday.  Pt denies prior h/o HTN or gestational HTN.  Pt's OB/Gyn, Dr. Ladean Raya, office closed today at noon.  Past Medical History:  Diagnosis Date  . Meningitis 2009    No Known Allergies  ROS General: Denies fever, chills, night sweats, changes in weight, changes in appetite HEENT: Denies ear pain, changes in vision, rhinorrhea, sore throat  +HAs CV: Denies CP, palpitations, SOB, orthopnea Pulm: Denies SOB, cough, wheezing GI: Denies abdominal pain, nausea, vomiting, diarrhea, constipation GU: Denies dysuria, hematuria, frequency, vaginal discharge Msk: Denies muscle cramps, joint pains Neuro: Denies weakness, numbness, tingling Skin: Denies rashes, bruising Psych: Denies depression, anxiety, hallucinations     Objective:    Blood pressure 118/86, pulse 76, temperature 98.5 F (36.9 C), temperature source Oral, SpO2 98 %, unknown if currently breastfeeding.   Gen. Pleasant, well-nourished, in no distress, normal affect   Lungs: no accessory muscle use Cardiovascular: RRR, no peripheral edema Neuro:  A&Ox3, CN II-XII intact, normal gait Skin:  Warm, no lesions/ rash   Wt Readings from Last 3 Encounters:  06/26/18 206 lb 12.8 oz (93.8 kg)  06/19/18 206 lb 1.6 oz (93.5 kg)  06/18/18 205 lb 9.6 oz (93.3 kg)    Lab Results  Component Value Date   WBC 10.9 (H) 06/26/2018   HGB 14.1 06/26/2018   HCT 41.8 06/26/2018   PLT 297 06/26/2018   GLUCOSE 103 (H) 06/26/2018   ALT 14 06/26/2018   AST 20 06/26/2018    NA 129 (L) 06/26/2018   K 4.0 06/26/2018   CL 103 06/26/2018   CREATININE 0.71 06/26/2018   BUN 8 06/26/2018   CO2 17 (L) 06/26/2018   HGBA1C 5.3 12/03/2017    Assessment/Plan:  Preeclampsia in postpartum period  -pt 9 days postpartum -BP in office 118/86, 120/90 - Plan: POCT Urinalysis Dipstick (Automated) -UA with protein, trace leuks, RBCs, SG> 1.030 -pt advised to proceed to the ED for further eval and possible need for Mag gtt -pt advised to contact OB/GYN to make them aware---triage call largely unhelpful, as pt was advised to f/u in office within 4 hrs.  Pt and husband advised to go to ED.  F/u prn  Abbe Amsterdam, MD

## 2018-07-05 NOTE — Discharge Instructions (Signed)

## 2018-07-05 NOTE — Patient Instructions (Signed)
Preeclampsia and Eclampsia °Preeclampsia is a serious condition that develops only during pregnancy. It is also called toxemia of pregnancy. This condition causes high blood pressure along with other symptoms, such as swelling and headaches. These symptoms may develop as the condition gets worse. Preeclampsia may occur at 20 weeks of pregnancy or later. °Diagnosing and treating preeclampsia early is very important. If not treated early, it can cause serious problems for you and your baby. One problem it can lead to is eclampsia, which is a condition that causes muscle jerking or shaking (convulsions or seizures) in the mother. Delivering your baby is the best treatment for preeclampsia or eclampsia. Preeclampsia and eclampsia symptoms usually go away after your baby is born. °What are the causes? °The cause of preeclampsia is not known. °What increases the risk? °The following risk factors make you more likely to develop preeclampsia: °· Being pregnant for the first time. °· Having had preeclampsia during a past pregnancy. °· Having a family history of preeclampsia. °· Having high blood pressure. °· Being pregnant with twins or triplets. °· Being 35 or older. °· Being African-American. °· Having kidney disease or diabetes. °· Having medical conditions such as lupus or blood diseases. °· Being very overweight (obese). ° °What are the signs or symptoms? °The earliest signs of preeclampsia are: °· High blood pressure. °· Increased protein in your urine. Your health care provider will check for this at every visit before you give birth (prenatal visit). ° °Other symptoms that may develop as the condition gets worse include: °· Severe headaches. °· Sudden weight gain. °· Swelling of the hands, face, legs, and feet. °· Nausea and vomiting. °· Vision problems, such as blurred or double vision. °· Numbness in the face, arms, legs, and feet. °· Urinating less than usual. °· Dizziness. °· Slurred speech. °· Abdominal pain,  especially upper abdominal pain. °· Convulsions or seizures. ° °Symptoms generally go away after giving birth. °How is this diagnosed? °There are no screening tests for preeclampsia. Your health care provider will ask you about symptoms and check for signs of preeclampsia during your prenatal visits. You may also have tests that include: °· Urine tests. °· Blood tests. °· Checking your blood pressure. °· Monitoring your baby’s heart rate. °· Ultrasound. ° °How is this treated? °You and your health care provider will determine the treatment approach that is best for you. Treatment may include: °· Having more frequent prenatal exams to check for signs of preeclampsia, if you have an increased risk for preeclampsia. °· Bed rest. °· Reducing how much salt (sodium) you eat. °· Medicine to lower your blood pressure. °· Staying in the hospital, if your condition is severe. There, treatment will focus on controlling your blood pressure and the amount of fluids in your body (fluid retention). °· You may need to take medicine (magnesium sulfate) to prevent seizures. This medicine may be given as an injection or through an IV tube. °· Delivering your baby early, if your condition gets worse. You may have your labor started with medicine (induced), or you may have a cesarean delivery. ° °Follow these instructions at home: °Eating and drinking ° °· Drink enough fluid to keep your urine clear or pale yellow. °· Eat a healthy diet that is low in sodium. Do not add salt to your food. Check nutrition labels to see how much sodium a food or beverage contains. °· Avoid caffeine. °Lifestyle °· Do not use any products that contain nicotine or tobacco, such as cigarettes   and e-cigarettes. If you need help quitting, ask your health care provider. °· Do not use alcohol or drugs. °· Avoid stress as much as possible. Rest and get plenty of sleep. °General instructions °· Take over-the-counter and prescription medicines only as told by your  health care provider. °· When lying down, lie on your side. This keeps pressure off of your baby. °· When sitting or lying down, raise (elevate) your feet. Try putting some pillows underneath your lower legs. °· Exercise regularly. Ask your health care provider what kinds of exercise are best for you. °· Keep all follow-up and prenatal visits as told by your health care provider. This is important. °How is this prevented? °To prevent preeclampsia or eclampsia from developing during another pregnancy: °· Get proper medical care during pregnancy. Your health care provider may be able to prevent preeclampsia or diagnose and treat it early. °· Your health care provider may have you take a low-dose aspirin or a calcium supplement during your next pregnancy. °· You may have tests of your blood pressure and kidney function after giving birth. °· Maintain a healthy weight. Ask your health care provider for help managing weight gain during pregnancy. °· Work with your health care provider to manage any long-term (chronic) health conditions you have, such as diabetes or kidney problems. ° °Contact a health care provider if: °· You gain more weight than expected. °· You have headaches. °· You have nausea or vomiting. °· You have abdominal pain. °· You feel dizzy or light-headed. °Get help right away if: °· You develop sudden or severe swelling anywhere in your body. This usually happens in the legs. °· You gain 5 lbs (2.3 kg) or more during one week. °· You have severe: °? Abdominal pain. °? Headaches. °? Dizziness. °? Vision problems. °? Confusion. °? Nausea or vomiting. °· You have a seizure. °· You have trouble moving any part of your body. °· You develop numbness in any part of your body. °· You have trouble speaking. °· You have any abnormal bleeding. °· You pass out. °This information is not intended to replace advice given to you by your health care provider. Make sure you discuss any questions you have with your health  care provider. °Document Released: 09/22/2000 Document Revised: 05/23/2016 Document Reviewed: 05/01/2016 °Elsevier Interactive Patient Education © 2018 [employer] ° °

## 2018-07-05 NOTE — MAU Note (Signed)
Urine in lab 

## 2018-07-10 ENCOUNTER — Ambulatory Visit: Payer: BC Managed Care – PPO | Admitting: *Deleted

## 2018-07-10 NOTE — Progress Notes (Signed)
Chart reviewed for nurse visit. Agree with plan of care.   Sharyon Cable, CNM 07/10/2018 11:24 AM

## 2018-07-10 NOTE — Progress Notes (Signed)
Subjective:  Yarelli Pebley is a 32 y.o. female here for BP check.   Hypertension ROS: taking medications as instructed, no medication side effects noted, no TIA's, no chest pain on exertion, no dyspnea on exertion and no swelling of ankles. Pt states some mild HA's but resolve after a short time, denies any changes in vision.   Objective:  BP 126/87   Pulse 83    Appearance alert, well appearing, and in no distress. General exam BP noted to be well controlled today in office.    Assessment:   Blood Pressure stable.   Plan:  Current treatment plan is effective, no change in therapy.  Pt advised to schedule PP appt in 2 weeks.

## 2018-07-17 ENCOUNTER — Encounter: Payer: Self-pay | Admitting: Family Medicine

## 2018-07-17 ENCOUNTER — Ambulatory Visit: Payer: BC Managed Care – PPO | Admitting: Family Medicine

## 2018-07-17 VITALS — BP 104/78 | HR 84 | Temp 99.2°F | Ht 62.0 in | Wt 189.0 lb

## 2018-07-17 DIAGNOSIS — Z8632 Personal history of gestational diabetes: Secondary | ICD-10-CM | POA: Diagnosis not present

## 2018-07-17 DIAGNOSIS — Z7689 Persons encountering health services in other specified circumstances: Secondary | ICD-10-CM

## 2018-07-17 DIAGNOSIS — O139 Gestational [pregnancy-induced] hypertension without significant proteinuria, unspecified trimester: Secondary | ICD-10-CM | POA: Diagnosis not present

## 2018-07-17 NOTE — Patient Instructions (Signed)
Postpartum Hypertension Postpartum hypertension is high blood pressure after pregnancy that remains higher than normal for more than two days after delivery. You may not realize that you have postpartum hypertension if your blood pressure is not being checked regularly. In some cases, postpartum hypertension will go away on its own, usually within a week of delivery. However, for some women, medical treatment is required to prevent serious complications, such as seizures or stroke. The following things can affect your blood pressure:  The type of delivery you had.  Having received IV fluids or other medicines during or after delivery.  What are the causes? Postpartum hypertension may be caused by any of the following or by a combination of any of the following:  Hypertension that existed before pregnancy (chronic hypertension).  Gestational hypertension.  Preeclampsia or eclampsia.  Receiving a lot of fluid through an IV during or after delivery.  Medicines.  HELLP syndrome.  Hyperthyroidism.  Stroke.  Other rare neurological or blood disorders.  In some cases, the cause may not be known. What increases the risk? Postpartum hypertension can be related to one or more risk factors, such as:  Chronic hypertension. In some cases, this may not have been diagnosed before pregnancy.  Obesity.  Type 2 diabetes.  Kidney disease.  Family history of preeclampsia.  Other medical conditions that cause hormonal imbalances.  What are the signs or symptoms? As with all types of hypertension, postpartum hypertension may not have any symptoms. Depending on how high your blood pressure is, you may experience:  Headaches. These may be mild, moderate, or severe. They may also be steady, constant, or sudden in onset (thunderclap headache).  Visual changes.  Dizziness.  Shortness of breath.  Swelling of your hands, feet, lower legs, or face. In some cases, you may have swelling in  more than one of these locations.  Heart palpitations or a racing heartbeat.  Difficulty breathing while lying down.  Decreased urination.  Other rare signs and symptoms may include:  Sweating more than usual. This lasts longer than a few days after delivery.  Chest pain.  Sudden dizziness when you get up from sitting or lying down.  Seizures.  Nausea or vomiting.  Abdominal pain.  How is this diagnosed? The diagnosis of postpartum hypertension is made through a combination of physical examination findings and testing of your blood and urine. You may also have additional tests, such as a CT scan or an MRI, to check for other complications of postpartum hypertension. How is this treated? When blood pressure is high enough to require treatment, your options may include:  Medicines to reduce blood pressure (antihypertensives). Tell your health care provider if you are breastfeeding or if you plan to breastfeed. There are many antihypertensive medicines that are safe to take while breastfeeding.  Stopping medicines that may be causing hypertension.  Treating medical conditions that are causing hypertension.  Treating the complications of hypertension, such as seizures, stroke, or kidney problems.  Your health care provider will also continue to monitor your blood pressure closely and repeatedly until it is within a safe range for you. Follow these instructions at home:  Take medicines only as directed by your health care provider.  Get regular exercise after your health care provider tells you that it is safe.  Follow your health care provider's recommendations on fluid and salt restrictions.  Do not use any tobacco products, including cigarettes, chewing tobacco, or electronic cigarettes. If you need help quitting, ask your health care provider.  Keep all follow-up visits as directed by your health care provider. This is important. Contact a health care provider  if:  Your symptoms get worse.  You have new symptoms, such as: ? Headache. ? Dizziness. ? Visual changes. Get help right away if:  You develop a severe or sudden headache.  You have seizures.  You develop numbness or weakness on one side of your body.  You have difficulty thinking, speaking, or swallowing.  You develop severe abdominal pain.  You develop difficulty breathing, chest pain, a racing heartbeat, or heart palpitations. These symptoms may represent a serious problem that is an emergency. Do not wait to see if the symptoms will go away. Get medical help right away. Call your local emergency services (911 in the [place] Do not drive yourself to the hospital. This information is not intended to replace advice given to you by your health care provider. Make sure you discuss any questions you have with your health care provider. Document Released: 05/29/2014 Document Revised: 02/28/2016 Document Reviewed: 04/09/2014 Elsevier Interactive Patient Education  2018 [employer].  Managing Your Hypertension Hypertension is commonly called high blood pressure. This is when the force of your blood pressing against the walls of your arteries is too strong. Arteries are blood vessels that carry blood from your heart throughout your body. Hypertension forces the heart to work harder to pump blood, and may cause the arteries to become narrow or stiff. Having untreated or uncontrolled hypertension can cause heart attack, stroke, kidney disease, and other problems. What are blood pressure readings? A blood pressure reading consists of a higher number over a lower number. Ideally, your blood pressure should be below 120/80. The first ("top") number is called the systolic pressure. It is a measure of the pressure in your arteries as your heart beats. The second ("bottom") number is called the diastolic pressure. It is a measure of the pressure in your arteries as the heart relaxes. What does my  blood pressure reading mean? Blood pressure is classified into four stages. Based on your blood pressure reading, your health care provider may use the following stages to determine what type of treatment you need, if any. Systolic pressure and diastolic pressure are measured in a unit called mm Hg. Normal  Systolic pressure: below 120.  Diastolic pressure: below 80. Elevated  Systolic pressure: 120-129.  Diastolic pressure: below 80. Hypertension stage 1  Systolic pressure: 130-139.  Diastolic pressure: 80-89. Hypertension stage 2  Systolic pressure: 140 or above.  Diastolic pressure: 90 or above. What health risks are associated with hypertension? Managing your hypertension is an important responsibility. Uncontrolled hypertension can lead to:  A heart attack.  A stroke.  A weakened blood vessel (aneurysm).  Heart failure.  Kidney damage.  Eye damage.  Metabolic syndrome.  Memory and concentration problems.  What changes can I make to manage my hypertension? Hypertension can be managed by making lifestyle changes and possibly by taking medicines. Your health care provider will help you make a plan to bring your blood pressure within a normal range. Eating and drinking  Eat a diet that is high in fiber and potassium, and low in salt (sodium), added sugar, and fat. An example eating plan is called the DASH (Dietary Approaches to Stop Hypertension) diet. To eat this way: ? Eat plenty of fresh fruits and vegetables. Try to fill half of your plate at each meal with fruits and vegetables. ? Eat whole grains, such as whole wheat pasta, brown rice, or  whole grain bread. Fill about one quarter of your plate with whole grains. ? Eat low-fat diary products. ? Avoid fatty cuts of meat, processed or cured meats, and poultry with skin. Fill about one quarter of your plate with lean proteins such as fish, chicken without skin, beans, eggs, and tofu. ? Avoid premade and processed  foods. These tend to be higher in sodium, added sugar, and fat.  Reduce your daily sodium intake. Most people with hypertension should eat less than 1,500 mg of sodium a day.  Limit alcohol intake to no more than 1 drink a day for nonpregnant women and 2 drinks a day for men. One drink equals 12 oz of beer, 5 oz of wine, or 1 oz of hard liquor. Lifestyle  Work with your health care provider to maintain a healthy body weight, or to lose weight. Ask what an ideal weight is for you.  Get at least 30 minutes of exercise that causes your heart to beat faster (aerobic exercise) most days of the week. Activities may include walking, swimming, or biking.  Include exercise to strengthen your muscles (resistance exercise), such as weight lifting, as part of your weekly exercise routine. Try to do these types of exercises for 30 minutes at least 3 days a week.  Do not use any products that contain nicotine or tobacco, such as cigarettes and e-cigarettes. If you need help quitting, ask your health care provider.  Control any long-term (chronic) conditions you have, such as high cholesterol or diabetes. Monitoring  Monitor your blood pressure at home as told by your health care provider. Your personal target blood pressure may vary depending on your medical conditions, your age, and other factors.  Have your blood pressure checked regularly, as often as told by your health care provider. Working with your health care provider  Review all the medicines you take with your health care provider because there may be side effects or interactions.  Talk with your health care provider about your diet, exercise habits, and other lifestyle factors that may be contributing to hypertension.  Visit your health care provider regularly. Your health care provider can help you create and adjust your plan for managing hypertension. Will I need medicine to control my blood pressure? Your health care provider may  prescribe medicine if lifestyle changes are not enough to get your blood pressure under control, and if:  Your systolic blood pressure is 130 or higher.  Your diastolic blood pressure is 80 or higher.  Take medicines only as told by your health care provider. Follow the directions carefully. Blood pressure medicines must be taken as prescribed. The medicine does not work as well when you skip doses. Skipping doses also puts you at risk for problems. Contact a health care provider if:  You think you are having a reaction to medicines you have taken.  You have repeated (recurrent) headaches.  You feel dizzy.  You have swelling in your ankles.  You have trouble with your vision. Get help right away if:  You develop a severe headache or confusion.  You have unusual weakness or numbness, or you feel faint.  You have severe pain in your chest or abdomen.  You vomit repeatedly.  You have trouble breathing. Summary  Hypertension is when the force of blood pumping through your arteries is too strong. If this condition is not controlled, it may put you at risk for serious complications.  Your personal target blood pressure may vary depending on your medical  conditions, your age, and other factors. For most people, a normal blood pressure is less than 120/80.  Hypertension is managed by lifestyle changes, medicines, or both. Lifestyle changes include weight loss, eating a healthy, low-sodium diet, exercising more, and limiting alcohol. This information is not intended to replace advice given to you by your health care provider. Make sure you discuss any questions you have with your health care provider. Document Released: 06/19/2012 Document Revised: 08/23/2016 Document Reviewed: 08/23/2016 Elsevier Interactive Patient Education  [employer].

## 2018-07-17 NOTE — Progress Notes (Signed)
Patient presents to clinic today to establish care.  SUBJECTIVE: PMH:  Pt is a 32 yo female with pmh sig for gestational DM, antepartum HTN.  Pt was seen on 9/ 27 for elevated bp and HA.  Pt was sent to the ED as she had protein in her urine.  At the ED pt's bp meds were increased and she was sent home.  Since that visit, pt states she has been doing well.  She states bp has been in 1 teens/85 and she still has a mild HA, but attributes that to lack of sleep.    H/o Gestational DM: -pt states she has been eating like normal. -has an appt in 2 wks to recheck bs. -not currently on meds  Allergies: NKDA  Past surgical history: none  Social history: Patient is married.  She has 2 children a son age 60 and a newborn daughter.  Patient denies alcohol, tobacco, drug use.  Patient is a former smoker.  She smoked less than half a pack during her college years.  Health Maintenance: Immunizations --influenza vaccine 06/27/2018 PAP --11/2017  Past Medical History:  Diagnosis Date  . Gestational diabetes   . Meningitis 2009    Past Surgical History:  Procedure Laterality Date  . NO PAST SURGERIES      Current Outpatient Medications on File Prior to Visit  Medication Sig Dispense Refill  . hydrochlorothiazide (HYDRODIURIL) 25 MG tablet Take 1 tablet (25 mg total) by mouth daily for 2 days. 2 tablet 0  . ibuprofen (ADVIL,MOTRIN) 600 MG tablet Take 1 tablet (600 mg total) by mouth every 6 (six) hours. 60 tablet 0  . NIFEdipine (PROCARDIA XL) 60 MG 24 hr tablet Take 1 tablet (60 mg total) by mouth daily. 30 tablet 0  . Prenatal Vit-Fe Fumarate-FA (PRENATAL VITAMIN PO) Take by mouth.     No current facility-administered medications on file prior to visit.     No Known Allergies  Family History  Problem Relation Age of Onset  . Hypertension Mother   . Hypertension Father   . Diabetes Father   . Throat cancer Father   . Diabetes Maternal Grandmother     Social History    Socioeconomic History  . Marital status: Married    Spouse name: Not on file  . Number of children: Not on file  . Years of education: Not on file  . Highest education level: Not on file  Occupational History  . Not on file  Social Needs  . Financial resource strain: Not on file  . Food insecurity:    Worry: Not on file    Inability: Not on file  . Transportation needs:    Medical: Not on file    Non-medical: Not on file  Tobacco Use  . Smoking status: Former Smoker    Types: Cigarettes    Last attempt to quit: 11/11/2017    Years since quitting: 0.6  . Smokeless tobacco: Never Used  Substance and Sexual Activity  . Alcohol use: No  . Drug use: Not Currently    Types: Marijuana    Comment: last smoked 11/21/17  . Sexual activity: Not Currently    Partners: Male    Birth control/protection: None  Lifestyle  . Physical activity:    Days per week: Not on file    Minutes per session: Not on file  . Stress: Not on file  Relationships  . Social connections:    Talks on phone: Not on file  Gets together: Not on file    Attends religious service: Not on file    Active member of club or organization: Not on file    Attends meetings of clubs or organizations: Not on file    Relationship status: Not on file  . Intimate partner violence:    Fear of current or ex partner: Not on file    Emotionally abused: Not on file    Physically abused: Not on file    Forced sexual activity: Not on file  Other Topics Concern  . Not on file  Social History Narrative  . Not on file    ROS  There were no vitals taken for this visit.  Physical Exam  Recent Results (from the past 2160 hour(s))  Cervicovaginal ancillary only     Status: None   Collection Time: 06/05/18 12:00 AM  Result Value Ref Range   Chlamydia Negative     Comment: Normal Reference Range - Negative   Neisseria gonorrhea Negative     Comment: Normal Reference Range - Negative  OB RESULTS CONSOLE GC/Chlamydia      Status: None   Collection Time: 06/05/18 12:00 AM  Result Value Ref Range   Gonorrhea Negative   Strep Gp B NAA     Status: Abnormal   Collection Time: 06/05/18  1:56 PM  Result Value Ref Range   Strep Gp B NAA Positive (A) Negative    Comment: Centers for Disease Control and Prevention (CDC) and American Congress of Obstetricians and Gynecologists (ACOG) guidelines for prevention of perinatal group B streptococcal (GBS) disease specify co-collection of a vaginal and rectal swab specimen to maximize sensitivity of GBS detection. Per the CDC and ACOG, swabbing both the lower vagina and rectum substantially increases the yield of detection compared with sampling the vagina alone. Penicillin G, ampicillin, or cefazolin are indicated for intrapartum prophylaxis of perinatal GBS colonization. Reflex susceptibility testing should be performed prior to use of clindamycin only on GBS isolates from penicillin-allergic women who are considered a high risk for anaphylaxis. Treatment with vancomycin without additional testing is warranted if resistance to clindamycin is noted.   Protein / creatinine ratio, urine     Status: Abnormal   Collection Time: 06/26/18  4:23 AM  Result Value Ref Range   Creatinine, Urine 95.00 mg/dL   Total Protein, Urine 29 mg/dL    Comment: NO NORMAL RANGE ESTABLISHED FOR THIS TEST   Protein Creatinine Ratio 0.31 (H) 0.00 - 0.15 mg/mg[Cre]    Comment: Performed at [facility], [address] [place], [place] [address]  CBC     Status: Abnormal   Collection Time: 06/26/18  4:34 AM  Result Value Ref Range   WBC 10.9 (H) 4.0 - 10.5 K/uL   RBC 4.55 3.87 - 5.11 MIL/uL   Hemoglobin 14.1 12.0 - 15.0 g/dL   HCT 41.8 36.0 - 46.0 %   MCV 91.9 78.0 - 100.0 fL   MCH 31.0 26.0 - 34.0 pg   MCHC 33.7 30.0 - 36.0 g/dL   RDW 13.8 11.5 - 15.5 %   Platelets 297 150 - 400 K/uL    Comment: Performed at [facility], [address], [place], [place] [address]  Type and  screen     Status: None   Collection Time: 06/26/18  5:15 AM  Result Value Ref Range   ABO/RH(D) A POS    Antibody Screen NEG    Sample Expiration      06/29/2018 Performed at [facility],  [place], [place] [address]   Comprehensive metabolic panel     Status: Abnormal   Collection Time: 06/26/18  5:17 AM  Result Value Ref Range   Sodium 129 (L) 135 - 145 mmol/L   Potassium 4.0 3.5 - 5.1 mmol/L   Chloride 103 98 - 111 mmol/L   CO2 17 (L) 22 - 32 mmol/L   Glucose, Bld 103 (H) 70 - 99 mg/dL   BUN 8 6 - 20 mg/dL   Creatinine, Ser 0.71 0.44 - 1.00 mg/dL   Calcium 9.0 8.9 - 10.3 mg/dL   Total Protein 7.1 6.5 - 8.1 g/dL   Albumin 3.2 (L) 3.5 - 5.0 g/dL   AST 20 15 - 41 U/L   ALT 14 0 - 44 U/L   Alkaline Phosphatase 155 (H) 38 - 126 U/L   Total Bilirubin 0.5 0.3 - 1.2 mg/dL   GFR calc non Af Amer >60 >60 mL/min   GFR calc Af Amer >60 >60 mL/min    Comment: (NOTE) The eGFR has been calculated using the CKD EPI equation. This calculation has not been validated in all clinical situations. eGFR's persistently <60 mL/min signify possible Chronic Kidney Disease.    Anion gap 9 5 - 15    Comment: Performed at [facility], [address], [place], [place] [address]  RPR     Status: None   Collection Time: 06/26/18  5:17 AM  Result Value Ref Range   RPR Ser Ql Non Reactive Non Reactive    Comment: (NOTE) Performed At: [facility] [address] [place], [place] [id number] Rush Farmer MD [id number]   Glucose, capillary     Status: Abnormal   Collection Time: 06/26/18  7:05 AM  Result Value Ref Range   Glucose-Capillary 112 (H) 70 - 99 mg/dL  Collect bld for placenta donatation     Status: None   Collection Time: 06/27/18  5:24 AM  Result Value Ref Range   Placenta donation bld collect COLLECTED BY LABORATORY     Comment: Performed at [facility], [address], [place], [place] [address]  POCT Urinalysis Dipstick (Automated)      Status: Abnormal   Collection Time: 07/05/18  2:50 PM  Result Value Ref Range   Color, UA yellow    Clarity, UA clear    Glucose, UA Negative Negative   Bilirubin, UA neg    Ketones, UA neg    Spec Grav, UA >=1.030 (A) 1.010 - 1.025   Blood, UA 3+    pH, UA 5.5 5.0 - 8.0   Protein, UA Positive (A) Negative   Urobilinogen, UA 1.0 0.2 or 1.0 E.U./dL   Nitrite, UA neg    Leukocytes, UA Trace (A) Negative  CBC     Status: None   Collection Time: 07/05/18  6:11 PM  Result Value Ref Range   WBC 9.4 4.0 - 10.5 K/uL   RBC 4.24 3.87 - 5.11 MIL/uL   Hemoglobin 13.1 12.0 - 15.0 g/dL   HCT 39.3 36.0 - 46.0 %   MCV 92.7 78.0 - 100.0 fL   MCH 30.9 26.0 - 34.0 pg   MCHC 33.3 30.0 - 36.0 g/dL   RDW 13.6 11.5 - 15.5 %   Platelets 327 150 - 400 K/uL    Comment: Performed at [facility], [address], [place], [place] [address]  Comprehensive metabolic panel     Status: Abnormal   Collection Time: 07/05/18  6:11 PM  Result Value Ref  Range   Sodium 139 135 - 145 mmol/L   Potassium 4.4 3.5 - 5.1 mmol/L   Chloride 102 98 - 111 mmol/L   CO2 28 22 - 32 mmol/L   Glucose, Bld 98 70 - 99 mg/dL   BUN 17 6 - 20 mg/dL   Creatinine, Ser 0.97 0.44 - 1.00 mg/dL   Calcium 9.5 8.9 - 10.3 mg/dL   Total Protein 7.0 6.5 - 8.1 g/dL   Albumin 3.4 (L) 3.5 - 5.0 g/dL   AST 51 (H) 15 - 41 U/L   ALT 51 (H) 0 - 44 U/L   Alkaline Phosphatase 91 38 - 126 U/L   Total Bilirubin 0.4 0.3 - 1.2 mg/dL   GFR calc non Af Amer >60 >60 mL/min   GFR calc Af Amer >60 >60 mL/min    Comment: (NOTE) The eGFR has been calculated using the CKD EPI equation. This calculation has not been validated in all clinical situations. eGFR's persistently <60 mL/min signify possible Chronic Kidney Disease.    Anion gap 9 5 - 15    Comment: Performed at [facility], [address] [place], [place] [address]  Protein / creatinine ratio, urine     Status: None   Collection Time: 07/05/18  6:30 PM  Result Value Ref Range    Creatinine, Urine 185.00 mg/dL   Total Protein, Urine 8 mg/dL    Comment: NO NORMAL RANGE ESTABLISHED FOR THIS TEST   Protein Creatinine Ratio 0.04 0.00 - 0.15 mg/mg[Cre]    Comment: Performed at [facility], [address], [place], [place] [address]    Assessment/Plan: Gestational hypertension, antepartum -controlled -continue Procardia 60 mg daily -continue monitoring bp at home -reduce sodium intake and increase physical activity.  History of gestational diabetes -discussed cutting down on carbs and intake of sweets -encouraged to keep f/u appt in the next few wks.  Encounter to establish care -We reviewed the PMH, PSH, FH, SH, Meds and Allergies. -We provided refills for any medications we will prescribe as needed. -We addressed current concerns per orders and patient instructions. -We have asked for records for pertinent exams, studies, vaccines and notes from previous providers. -We have advised patient to follow up per instructions below.  F/u prn  Grier Mitts, MD

## 2018-07-25 ENCOUNTER — Other Ambulatory Visit: Payer: Self-pay

## 2018-07-25 ENCOUNTER — Encounter: Payer: Self-pay | Admitting: Certified Nurse Midwife

## 2018-07-25 ENCOUNTER — Ambulatory Visit (INDEPENDENT_AMBULATORY_CARE_PROVIDER_SITE_OTHER): Payer: BC Managed Care – PPO | Admitting: Certified Nurse Midwife

## 2018-07-25 ENCOUNTER — Other Ambulatory Visit: Payer: BC Managed Care – PPO

## 2018-07-25 DIAGNOSIS — Z3202 Encounter for pregnancy test, result negative: Secondary | ICD-10-CM | POA: Diagnosis not present

## 2018-07-25 DIAGNOSIS — Z30017 Encounter for initial prescription of implantable subdermal contraceptive: Secondary | ICD-10-CM

## 2018-07-25 DIAGNOSIS — Z1389 Encounter for screening for other disorder: Secondary | ICD-10-CM | POA: Diagnosis not present

## 2018-07-25 LAB — POCT URINE PREGNANCY: Preg Test, Ur: NEGATIVE

## 2018-07-25 MED ORDER — ETONOGESTREL 68 MG ~~LOC~~ IMPL
68.0000 mg | DRUG_IMPLANT | Freq: Once | SUBCUTANEOUS | Status: AC
  Administered 2018-07-25: 68 mg via SUBCUTANEOUS

## 2018-07-25 NOTE — Patient Instructions (Signed)
Nexplanon Instructions After Insertion   Keep bandage clean and dry for 24 hours   May use ice/Tylenol/Ibuprofen for soreness or pain   If you develop fever, drainage or increased warmth from incision site-contact office immediately  Etonogestrel implant What is this medicine? ETONOGESTREL (et oh noe JES trel) is a contraceptive (birth control) device. It is used to prevent pregnancy. It can be used for up to 3 years. This medicine may be used for other purposes; ask your health care provider or pharmacist if you have questions. COMMON BRAND NAME(S): Implanon, Nexplanon What should I tell my health care provider before I take this medicine? They need to know if you have any of these conditions: -abnormal vaginal bleeding -blood vessel disease or blood clots -cancer of the breast, cervix, or liver -depression -diabetes -gallbladder disease -headaches -heart disease or recent heart attack -high blood pressure -high cholesterol -kidney disease -liver disease -renal disease -seizures -tobacco smoker -an unusual or allergic reaction to etonogestrel, other hormones, anesthetics or antiseptics, medicines, foods, dyes, or preservatives -pregnant or trying to get pregnant -breast-feeding How should I use this medicine? This device is inserted just under the skin on the inner side of your upper arm by a health care professional. Talk to your pediatrician regarding the use of this medicine in children. Special care may be needed. Overdosage: If you think you have taken too much of this medicine contact a poison control center or emergency room at once. NOTE: This medicine is only for you. Do not share this medicine with others. What if I miss a dose? This does not apply. What may interact with this medicine? Do not take this medicine with any of the following medications: -amprenavir -bosentan -fosamprenavir This medicine may also interact with the following  medications: -barbiturate medicines for inducing sleep or treating seizures -certain medicines for fungal infections like ketoconazole and itraconazole -grapefruit juice -griseofulvin -medicines to treat seizures like carbamazepine, felbamate, oxcarbazepine, phenytoin, topiramate -modafinil -phenylbutazone -rifampin -rufinamide -some medicines to treat HIV infection like atazanavir, indinavir, lopinavir, nelfinavir, tipranavir, ritonavir -St. John's wort This list may not describe all possible interactions. Give your health care provider a list of all the medicines, herbs, non-prescription drugs, or dietary supplements you use. Also tell them if you smoke, drink alcohol, or use illegal drugs. Some items may interact with your medicine. What should I watch for while using this medicine? This product does not protect you against HIV infection (AIDS) or other sexually transmitted diseases. You should be able to feel the implant by pressing your fingertips over the skin where it was inserted. Contact your doctor if you cannot feel the implant, and use a non-hormonal birth control method (such as condoms) until your doctor confirms that the implant is in place. If you feel that the implant may have broken or become bent while in your arm, contact your healthcare provider. What side effects may I notice from receiving this medicine? Side effects that you should report to your doctor or health care professional as soon as possible: -allergic reactions like skin rash, itching or hives, swelling of the face, lips, or tongue -breast lumps -changes in emotions or moods -depressed mood -heavy or prolonged menstrual bleeding -pain, irritation, swelling, or bruising at the insertion site -scar at site of insertion -signs of infection at the insertion site such as fever, and skin redness, pain or discharge -signs of pregnancy -signs and symptoms of a blood clot such as breathing problems; changes in  vision; chest pain; severe,   sudden headache; pain, swelling, warmth in the leg; trouble speaking; sudden numbness or weakness of the face, arm or leg -signs and symptoms of liver injury like dark yellow or brown urine; general ill feeling or flu-like symptoms; light-colored stools; loss of appetite; nausea; right upper belly pain; unusually weak or tired; yellowing of the eyes or skin -unusual vaginal bleeding, discharge -signs and symptoms of a stroke like changes in vision; confusion; trouble speaking or understanding; severe headaches; sudden numbness or weakness of the face, arm or leg; trouble walking; dizziness; loss of balance or coordination Side effects that usually do not require medical attention (report to your doctor or health care professional if they continue or are bothersome): -acne -back pain -breast pain -changes in weight -dizziness -general ill feeling or flu-like symptoms -headache -irregular menstrual bleeding -nausea -sore throat -vaginal irritation or inflammation This list may not describe all possible side effects. Call your doctor for medical advice about side effects. You may report side effects to FDA at 1-800-FDA-1088. Where should I keep my medicine? This drug is given in a hospital or clinic and will not be stored at home. NOTE: This sheet is a summary. It may not cover all possible information. If you have questions about this medicine, talk to your doctor, pharmacist, or health care provider.  2018 Elsevier/Gold Standard (2016-04-13 11:19:22)  

## 2018-07-25 NOTE — Progress Notes (Signed)
Postpartum examination and Nexplanon insertion   Subjective:     Leslie Cook is a 32 y.o. female who presents for a postpartum visit. She is 4 weeks postpartum following a spontaneous vaginal delivery. I have fully reviewed the prenatal and intrapartum course. The delivery was at 39.6 gestational weeks. Outcome: spontaneous vaginal delivery. Anesthesia: epidural. Postpartum course has been Unremarkable. Baby's course has been Unremarkable. Baby is feeding by breast. Bleeding thin lochia. Bowel function is normal. Bladder function is normal. Patient is not sexually active. Contraception method is none, wants Nexplanon. Postpartum depression screening: negative.  The following portions of the patient's history were reviewed and updated as appropriate: allergies, current medications, past surgical history and problem list.  Review of Systems Pertinent items noted in HPI and remainder of comprehensive ROS otherwise negative.   Objective:    BP 120/81   Pulse 70   Ht 5\' 2"  (1.575 m)   Wt 188 lb 14.4 oz (85.7 kg)   Breastfeeding? Yes   BMI 34.55 kg/m   General:  alert, cooperative and no distress   Breasts:  inspection negative, no nipple discharge or bleeding, no masses or nodularity palpable  Lungs: clear to auscultation bilaterally  Heart:  regular rate and rhythm and S1, S2 normal  Abdomen: soft, non-tender; bowel sounds normal; no masses,  no organomegaly   Vulva:  not evaluated  Vagina: not evaluated  Cervix:  not evaluated  Corpus: not examined  Adnexa:  not evaluated  Rectal Exam: Not performed.        PROCEDURE: Nexplanon Insertion Procedure Patient was given informed consent, she signed consent form.  Patient does understand that irregular bleeding is a very common side effect of this medication. She was advised to have backup contraception for one week after placement. Pregnancy test in clinic today was negative.  Appropriate time out taken.  Patient's left arm was  prepped and draped in the usual sterile fashion.. The ruler used to measure and mark insertion area.  Patient was prepped with alcohol swab and then injected with 3 ml of 1% lidocaine.  She was prepped with betadine, Nexplanon removed from packaging,  Device confirmed in needle, then inserted full length of needle and withdrawn per handbook instructions. Nexplanon was able to palpated in the patient's arm; patient palpated the insert herself. There was minimal blood loss.  Patient insertion site covered with guaze and a pressure bandage to reduce any bruising.  The patient tolerated the procedure well and was given post procedure instructions.   Assessment/Plan:  1. Postpartum care and examination - Normal postpartum exam. Pap smear not done at today's visit.  - Glucose tolerance, 2 hours  2. Encounter for initial prescription of Nexplanon - UPT negative in office today  - See procedure note above for Nexplanon insertion  - POCT urine pregnancy - etonogestrel (NEXPLANON) implant 68 mg  Contraception: Nexplanon Follow up in: 1 year for annual examination or as needed.   Will follow up with results of GTT and manage accordingly   Sharyon Cable, CNM 07/25/18

## 2018-07-26 LAB — GLUCOSE TOLERANCE, 2 HOURS
Glucose, 2 hour: 77 mg/dL (ref 65–139)
Glucose, GTT - Fasting: 87 mg/dL (ref 65–99)

## 2018-07-31 ENCOUNTER — Other Ambulatory Visit: Payer: BC Managed Care – PPO

## 2018-08-05 ENCOUNTER — Other Ambulatory Visit: Payer: BC Managed Care – PPO

## 2018-08-05 ENCOUNTER — Ambulatory Visit: Payer: BC Managed Care – PPO | Admitting: Obstetrics and Gynecology

## 2018-08-29 IMAGING — US US MFM OB TRANSVAGINAL
1 series · 12 of 28 positions shown · non-contrast
Comparison: none

Road [HOSPITAL]

Indications
17 weeks gestation of pregnancy
Abnormal biochemical screen (Panorama-
vanishing twin)
Obesity complicating pregnancy, second
trimester (pregravid BMI 32)
Poor obstetric history: Previous preterm
delivery, antepartum (36 wks)
Encounter for fetal anatomic survey
Encounter for cervical length
OB History
Gravidity:    2         Term:   0        Prem:   1        SAB:   0
TOP:          0       Ectopic:  0        Living: 1
Fetal Evaluation
Num Of Fetuses:     1
Fetal Heart         148
Rate(bpm):
Cardiac Activity:   Observed
Presentation:       Breech
Placenta:           Posterior, above cervical os
P. Cord Insertion:  Visualized
Amniotic Fluid
AFI FV:      Subjectively within normal limits
Largest Pocket(cm)
4.0
Biometry
BPD:      36.9  mm     G. Age:  17w 2d         37  %    CI:        70.85   %    70 - 86
FL/HC:      18.3   %    15.8 - 18
HC:      139.7  mm     G. Age:  17w 2d         29  %    HC/AC:      1.15        1.07 -
AC:      121.9  mm     G. Age:  17w 6d         58  %    FL/BPD:     69.1   %
FL:       25.5  mm     G. Age:  17w 5d         51  %    FL/AC:      20.9   %    20 - 24
HUM:      25.4  mm     G. Age:  18w 0d         67  %
CER:      15.7  mm     G. Age:  15w 5d          8  %
NFT:       3.3  mm
CM:          3  mm
Est. FW:     207  gm      0 lb 7 oz     54  %
Gestational Age
LMP:           19w 2d        Date:  09/08/17                 EDD:   06/15/18
U/S Today:     17w 4d                                        EDD:   06/27/18
Best:          17w 4d     Det. By:  Early Ultrasound         EDD:   06/27/18
(12/06/17)
Anatomy
Cranium:               Appears normal         LVOT:                   Appears normal
Cavum:                 Appears normal         Aortic Arch:            Appears normal
Ventricles:            Appears normal         Ductal Arch:            Appears normal
Choroid Plexus:        Appears normal         Diaphragm:              Appears normal
Cerebellum:            Appears normal         Stomach:                Appears normal, left
sided
Posterior Fossa:       Appears normal         Abdomen:                Appears normal
Nuchal Fold:           Appears normal         Abdominal Wall:         Appears nml (cord
insert, abd wall)
Face:                  Appears normal         Cord Vessels:           Appears normal (3
(orbits and profile)                           vessel cord)
Lips:                  Appears normal         Kidneys:                Appear normal
Palate:                Appears normal         Bladder:                Appears normal
Thoracic:              Appears normal         Spine:                  Appears normal
Heart:                 Appears normal         Upper Extremities:      Appears normal
(4CH, axis, and situs
RVOT:                  Appears normal         Lower Extremities:      Appears normal
Other:  Parents do not wish to know sex of fetus at this time. Heels and 5th
digit visualized. Nasal bone visualized. Technically difficult due to
fetal position.
Cervix Uterus Adnexa
Cervix
Length:            3.2  cm.
Normal appearance by transvaginal scan
Uterus
No abnormality visualized.
Left Ovary
Not visualized.
Right Ovary
Within normal limits.
Cul De Sac:   No free fluid seen.
Adnexa:       No abnormality visualized.
Impression
INDICATION: 32 yr old [id number] at 73w9d with previous preterm
delivery for fetal anatomic survey and cervical length.

[Series 1: us mfm ob transvaginal · 12 of 114 slices shown]
[im 5/114]
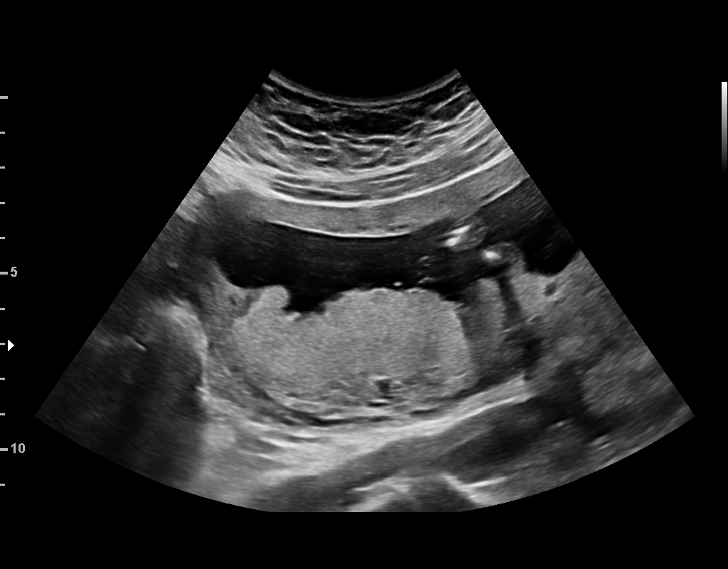
[im 13/114]
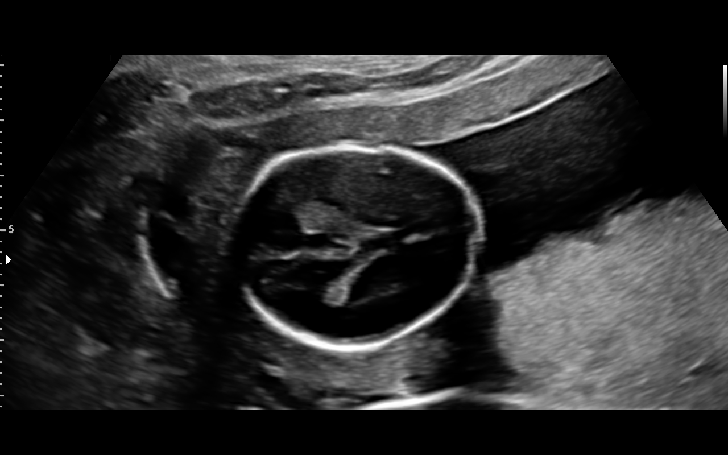
[im 21/114]
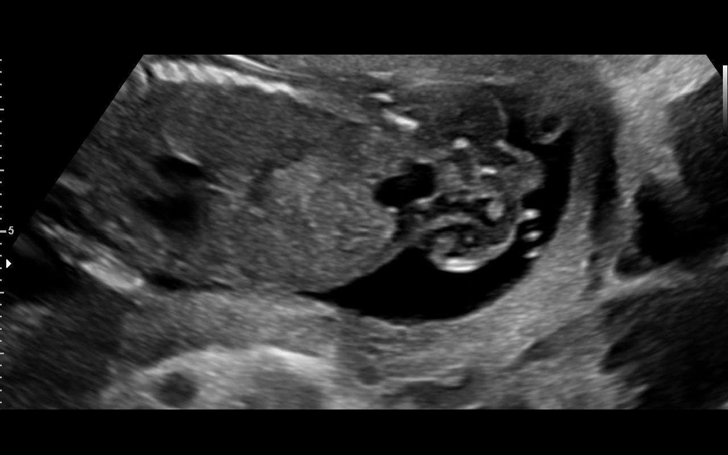
[im 34/114]
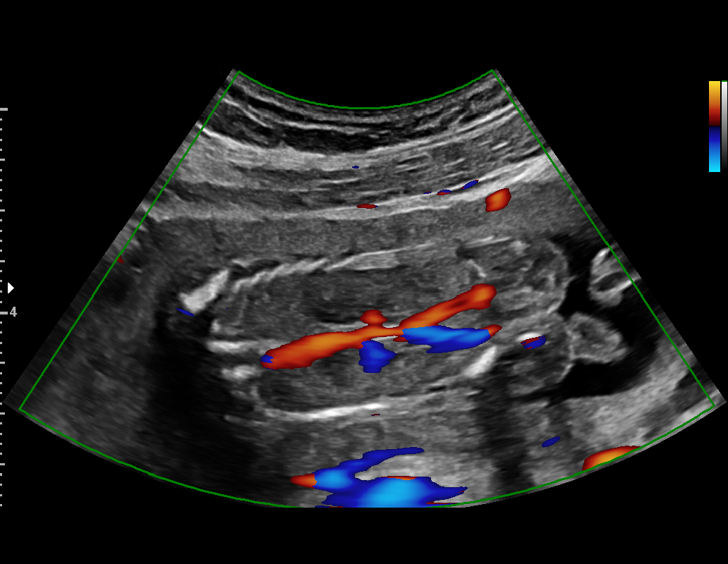
[im 42/114]
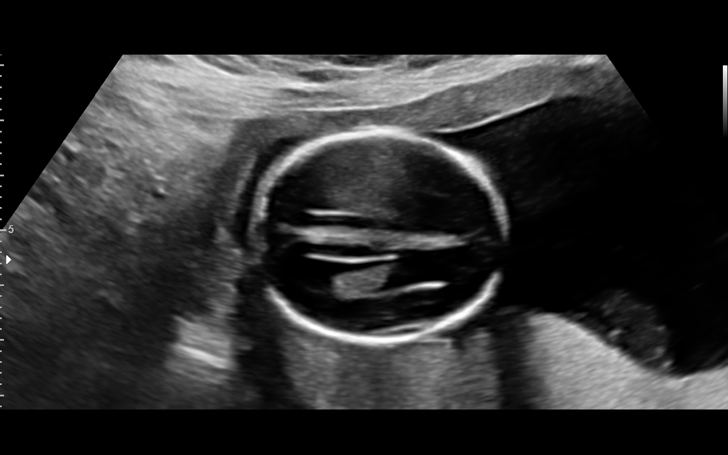
[im 51/114]
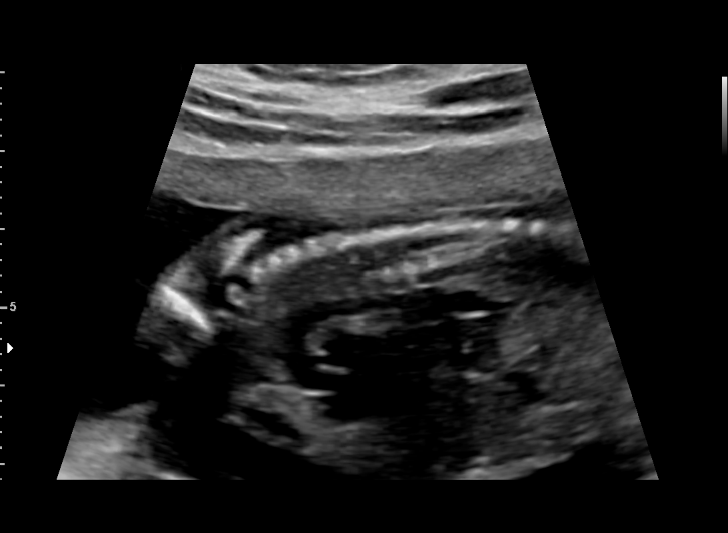
[im 63/114]
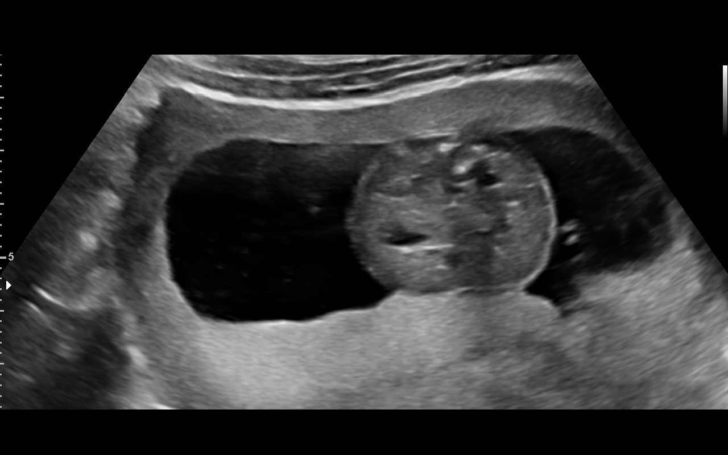
[im 72/114]
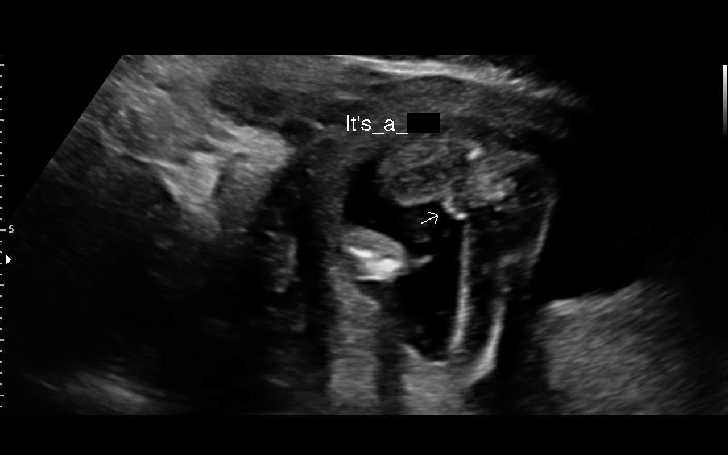
[im 80/114]
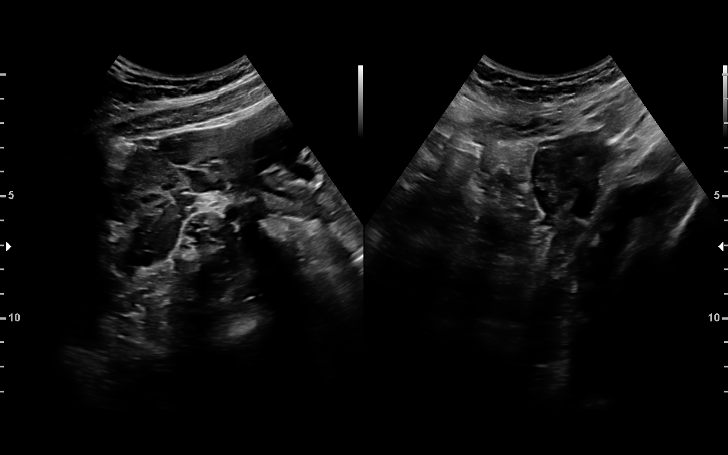
[im 93/114]
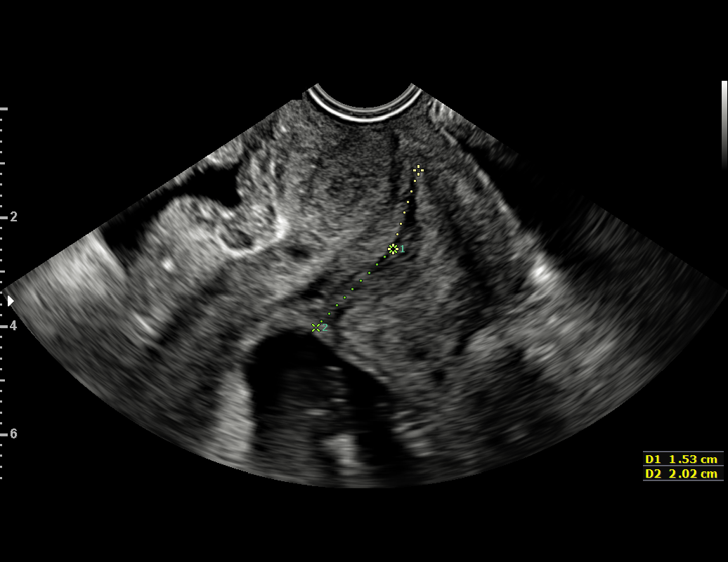
[im 101/114]
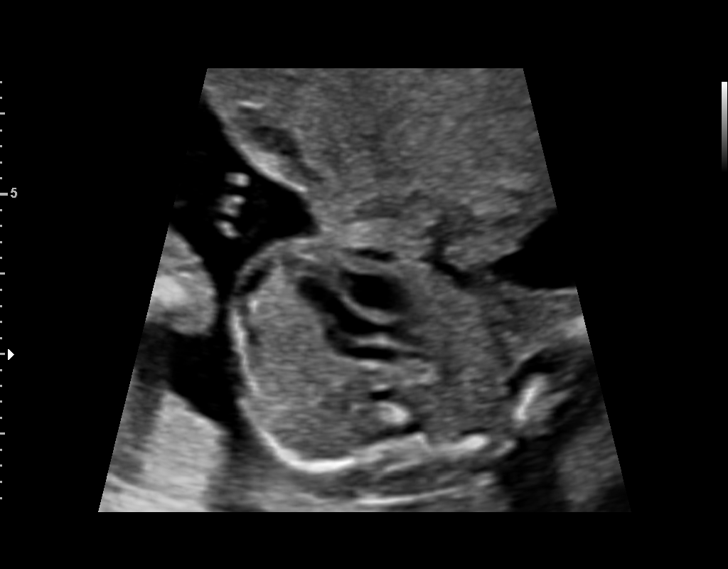
[im 109/114]
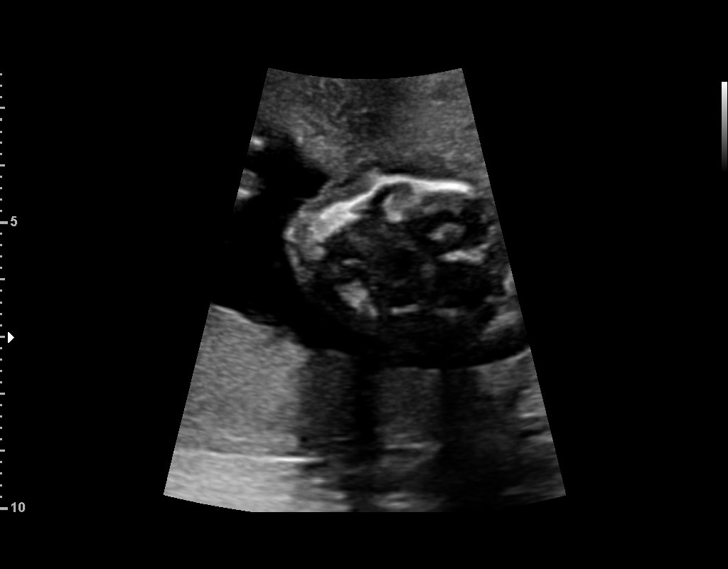

[12 of 28 positions shown; findings below may reference images not displayed]

FINDINGS: 1. Single intrauterine pregnancy with normal cardiac activity.
2. Fetal biometry is consistent with dating.
3. Posterior placenta without evidence of previa.
4. Normal amniotic fluid volume.
5. Normal transvaginal cervical length.
6. No adnexal masses seen.
7. Normal fetal anatomic survey.
Recommendations

1. Appropriate fetal growth.
2. Normal fetal anatomic survey.
3. Previous preterm delivery:
- PPROM at 36w
- on Dzoskun
- normal cervical length today
- no further cervical length surveillance needed unless
symptoms develop
- recommend preterm labor precautions
4. Panorama returned vanishing twin:
- early ultrasound showed evidence of vanishing twin (not
well visualized on today's exam)
- patient met with the genetic counselor; see separate report
- after counseling patient opted to have cell free fetal DNA
redrawn today
5. Recommend follow up ultrasounds as clinically indicated

## 2018-10-31 ENCOUNTER — Ambulatory Visit: Payer: BC Managed Care – PPO | Admitting: Family Medicine

## 2018-10-31 ENCOUNTER — Encounter: Payer: Self-pay | Admitting: Family Medicine

## 2018-10-31 VITALS — BP 106/70 | HR 84 | Temp 98.9°F | Wt 179.0 lb

## 2018-10-31 DIAGNOSIS — K429 Umbilical hernia without obstruction or gangrene: Secondary | ICD-10-CM | POA: Diagnosis not present

## 2018-10-31 NOTE — Progress Notes (Signed)
Subjective:    Patient ID: Leslie Cook, female    DOB: 1985/11/15, 33 y.o.   MRN: [id number]  No chief complaint on file.   HPI Patient was seen today for acute concern.  Pt endorses feeling a "knot" above her navel x1 month.  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  Pt endorses mild discomfort while doing these activities that quickly resolved.  Pt states she does not notice the bump when lying down.  Pt has a h/o vaginal deliveries.  Denies abd surgeries or h/o fibroids.  Past Medical History:  Diagnosis Date  . Gestational diabetes   . Gestational diabetes   . Hypertension   . Meningitis 2009    No Known Allergies  ROS General: Denies fever, chills, night sweats, changes in weight, changes in appetite HEENT: Denies headaches, ear pain, changes in vision, rhinorrhea, sore throat CV: Denies CP, palpitations, SOB, orthopnea Pulm: Denies SOB, cough, wheezing GI: Denies abdominal pain, nausea, vomiting, diarrhea, constipation GU: Denies dysuria, hematuria, frequency, vaginal discharge Msk: Denies muscle cramps, joint pains  +"knot" about navel. Neuro: Denies weakness, numbness, tingling Skin: Denies rashes, bruising Psych: Denies depression, anxiety, hallucinations    Objective:    Blood pressure 106/70, pulse 84, temperature 98.9 F (37.2 C), temperature source Oral, weight 179 lb (81.2 kg), last menstrual period 09/30/2018, SpO2 98 %, currently breastfeeding.   Gen. Pleasant, well-nourished, in no distress, normal affect   HEENT: [place]/AT, face symmetric, no scleral icterus, PERRLA, nares patent without drainage. Lungs: no accessory muscle use, CTAB, no wheezes or rales Cardiovascular: RRR, no peripheral edema Abdomen: BS present, soft, NT/ND, 2.5 cm x 1.5 cm round firm reducible area superior to umbilicus.  No erythema noted.  Area not palpable with pt in supine position.  no  hepatosplenomegaly. Neuro:  A&Ox3, CN II-XII intact, normal gait  Wt Readings from Last 3 Encounters:  10/31/18 179 lb (81.2 kg)  07/25/18 188 lb 14.4 oz (85.7 kg)  07/17/18 189 lb (85.7 kg)    Lab Results  Component Value Date   WBC 9.4 07/05/2018   HGB 13.1 07/05/2018   HCT 39.3 07/05/2018   PLT 327 07/05/2018   GLUCOSE 98 07/05/2018   ALT 51 (H) 07/05/2018   AST 51 (H) 07/05/2018   NA 139 07/05/2018   K 4.4 07/05/2018   CL 102 07/05/2018   CREATININE 0.97 07/05/2018   BUN 17 07/05/2018   CO2 28 07/05/2018   HGBA1C 5.3 12/03/2017    Assessment/Plan:  Paraumbilical hernia  -reducible. -discussed various causes -discussed treatment options including surgical repair.  Pt wishes to observe at this time. -pt given strict RTC or ED precautions -given handout   F/u prn   Abbe Amsterdam, MD

## 2018-10-31 NOTE — Patient Instructions (Signed)
Hernia, Adult         A hernia is the bulging of an organ or tissue through a weak spot in the muscles of the abdomen (abdominal wall). Hernias develop most often near the belly button (navel) or the area where the leg meets the lower abdomen (groin).  Common types of hernias include:  · Incisional hernia. This type bulges through a scar from an abdominal surgery.  · Umbilical hernia. This type develops near the navel.  · Inguinal hernia. This type develops in the groin or scrotum.  · Femoral hernia. This type develops under the groin, in the upper thigh area.  · Hiatal hernia. This type occurs when part of the stomach slides above the muscle that separates the abdomen from the chest (diaphragm).  What are the causes?  This condition may be caused by:  · Heavy lifting.  · Coughing over a long period of time.  · Straining to have a bowel movement. Constipation can lead to straining.  · An incision made during an abdominal surgery.  · A physical problem that is present at birth (congenital defect).  · Being overweight or obese.  · Smoking.  · Excess fluid in the abdomen.  · Undescended testicles in males.  What are the signs or symptoms?  The main symptom is a skin-colored, rounded bulge in the area of the hernia. However, a bulge may not always be present. It may grow bigger or be more visible when you cough or strain (such as when lifting something heavy).  A hernia that can be pushed back into the area (is reducible) rarely causes pain. A hernia that cannot be pushed back into the area (is incarcerated) may lose its blood supply (become strangulated). A hernia that is incarcerated may cause:  · Pain.  · Fever.  · Nausea and vomiting.  · Swelling.  · Constipation.  How is this diagnosed?  A hernia may be diagnosed based on:  · Your symptoms and medical history.  · A physical exam. Your health care provider may ask you to cough or move in certain ways to see if the hernia becomes visible.  · Imaging tests, such  as:  ? X-rays.  ? Ultrasound.  ? CT scan.  How is this treated?  A hernia that is small and painless may not need to be treated. A hernia that is large or painful may be treated with surgery. Inguinal hernias may be treated with surgery to prevent incarceration or strangulation. Strangulated hernias are always treated with surgery because a lack of blood supply to the trapped organ or tissue can cause it to die.  Surgery to treat a hernia involves pushing the bulge back into place and repairing the weak area of the muscle or abdominal wall.  Follow these instructions at home:  Activity  · Avoid straining.  · Do not lift anything that is heavier than 10 lb (4.5 kg), or the limit that you are told, until your health care provider says that it is safe.  · When lifting heavy objects, lift with your leg muscles, not your back muscles.  Preventing constipation  · Take actions to prevent constipation. Constipation leads to straining with bowel movements, which can make a hernia worse or cause a hernia repair to break down. Your health care provider may recommend that you:  ? Drink enough fluid to keep your urine pale yellow.  ? Eat foods that are high in fiber, such as fresh fruits and vegetables, whole   grains, and beans.  ? Limit foods that are high in fat and processed sugars, such as fried or sweet foods.  ? Take an over-the-counter or prescription medicine for constipation.  General instructions  · When coughing, try to cough gently.  · You may try to push the hernia back in place by very gently pressing on it while lying down. Do not try to force the bulge back in if it will not push in easily.  · If you are overweight, work with your health care provider to lose weight safely.  · Do not use any products that contain nicotine or tobacco, such as cigarettes and e-cigarettes. If you need help quitting, ask your health care provider.  · If you are scheduled for hernia repair, watch your hernia for any changes in shape,  size, or color. Tell your health care provider about any changes or new symptoms.  · Take over-the-counter and prescription medicines only as told by your health care provider.  · Keep all follow-up visits as told by your health care provider. This is important.  Contact a health care provider if:  · You develop new pain, swelling, or redness around your hernia.  · You have signs of constipation, such as:  ? Fewer bowel movements in a week than normal.  ? Difficulty having a bowel movement.  ? Stools that are dry, hard, or larger than normal.  Get help right away if:  · You have a fever.  · You have abdomen pain that gets worse.  · You feel nauseous or you vomit.  · You cannot push the hernia back in place by very gently pressing on it while lying down. Do not try to force the bulge back in if it will not push in easily.  · The hernia:  ? Changes in shape, size, or color.  ? Feels hard or tender.  These symptoms may represent a serious problem that is an emergency. Do not wait to see if the symptoms will go away. Get medical help right away. Call your local emergency services (911 in the [place]  Summary  · A hernia is the bulging of an organ or tissue through a weak spot in the muscles of the abdomen (abdominal wall).  · The main symptom is a skin-colored, rounded lump (bulge) in the hernia area. However, a bulge may not always be present. It may grow bigger or more visible when you cough or strain (such as when having a bowel movement).  · A hernia that is small and painless may not need to be treated. A hernia that is large or painful may be treated with surgery.  · Surgery to treat a hernia involves pushing the bulge back into place and repairing the weak part of the abdomen.  This information is not intended to replace advice given to you by your health care provider. Make sure you discuss any questions you have with your health care provider.  Document Released: 09/25/2005 Document Revised: 06/27/2017 Document  Reviewed: 06/27/2017  Elsevier Interactive Patient Education © 2019 [employer]

## 2018-11-28 ENCOUNTER — Ambulatory Visit: Payer: BC Managed Care – PPO | Admitting: Family Medicine

## 2018-11-28 VITALS — BP 120/84 | HR 80 | Temp 98.7°F | Wt 175.0 lb

## 2018-11-28 DIAGNOSIS — J069 Acute upper respiratory infection, unspecified: Secondary | ICD-10-CM

## 2018-11-28 DIAGNOSIS — B9789 Other viral agents as the cause of diseases classified elsewhere: Secondary | ICD-10-CM

## 2018-12-03 ENCOUNTER — Encounter: Payer: Self-pay | Admitting: Family Medicine

## 2018-12-03 NOTE — Progress Notes (Signed)
Subjective:    Patient ID: Leslie Cook, female    DOB: [birth year], 33 y.o.   MRN: [id number]  No chief complaint on file.   HPI Patient was seen today for ongoing concern.  Patient states she is concerned that she is still sick from having the flu.  Pt had the flu at the end of January 2020.  Pt notes sore throat on Monday and waking up with a cough this a.m.  Pt denies rhinorrhea, fever, chills, ear pain/pressure.  Possible sick contacts include coworkers/kids at work.  Pt has not taken anything for her symptoms as she is breast feeding.  Past Medical History:  Diagnosis Date  . Gestational diabetes   . Gestational diabetes   . Hypertension   . Meningitis 2009    No Known Allergies  ROS General: Denies fever, chills, night sweats, changes in weight, changes in appetite HEENT: Denies headaches, ear pain, changes in vision, rhinorrhea  +sore throat CV: Denies CP, palpitations, SOB, orthopnea Pulm: Denies SOB, wheezing  +cough GI: Denies abdominal pain, nausea, vomiting, diarrhea, constipation GU: Denies dysuria, hematuria, frequency, vaginal discharge Msk: Denies muscle cramps, joint pains Neuro: Denies weakness, numbness, tingling Skin: Denies rashes, bruising Psych: Denies depression, anxiety, hallucinations    Objective:    Blood pressure 120/84, pulse 80, temperature 98.7 F (37.1 C), weight 175 lb (79.4 kg), SpO2 98 %, currently breastfeeding.  Gen. Pleasant, well-nourished, in no distress, normal affect   HEENT: [place]/AT, face symmetric, no scleral icterus, PERRLA, nares patent without drainage, pharynx with erythema, no exudate. TMs full b/l.  No cervical lymphadenopathy. Lungs: no accessory muscle use, CTAB, no wheezes or rales Cardiovascular: RRR, no m/r/g, no peripheral edema Neuro:  A&Ox3, CN II-XII intact, normal gait Skin:  Warm, no lesions/ rash  Wt Readings from Last 3 Encounters:  10/31/18 179 lb (81.2 kg)  07/25/18 188 lb 14.4 oz (85.7 kg)  07/17/18  189 lb (85.7 kg)    Lab Results  Component Value Date   WBC 9.4 07/05/2018   HGB 13.1 07/05/2018   HCT 39.3 07/05/2018   PLT 327 07/05/2018   GLUCOSE 98 07/05/2018   ALT 51 (H) 07/05/2018   AST 51 (H) 07/05/2018   NA 139 07/05/2018   K 4.4 07/05/2018   CL 102 07/05/2018   CREATININE 0.97 07/05/2018   BUN 17 07/05/2018   CO2 28 07/05/2018   HGBA1C 5.3 12/03/2017    Assessment/Plan:  Viral URI with cough  -supportive care.  Ok to use Tylenol if needed for pain or discomfort, gargle with warm salt water or chloraseptic spray. -rapid strep negative -f/u prn  Abbe Amsterdam, MD

## 2019-01-09 IMAGING — US US MFM FETAL BPP W/O NON-STRESS
1 series · 14 of 28 positions shown · non-contrast
Comparison: none

[Series 1: us mfm fetal bpp w/o non-stress · 42 acquisitions, 14 frames shown]
[im 2/42]
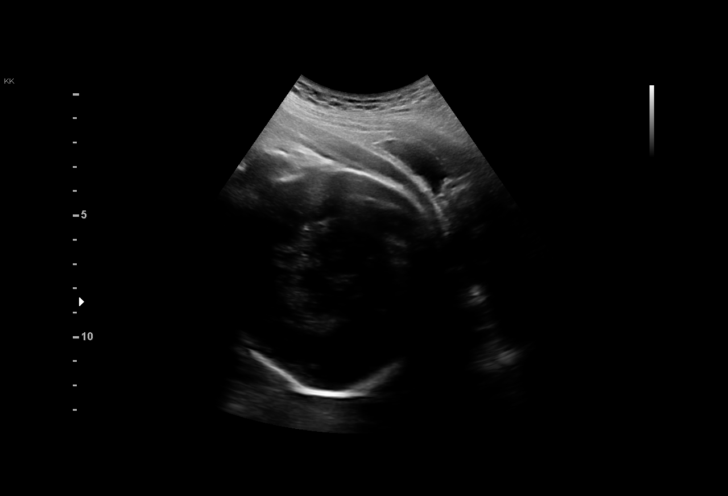
[im 5/42]
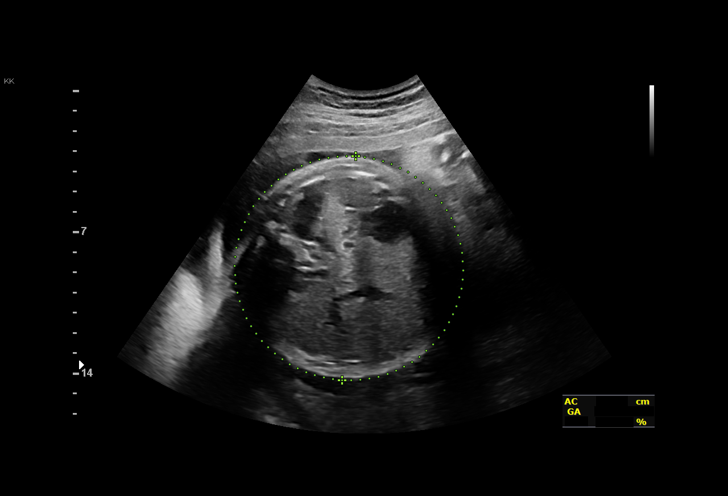
[im 8/42]
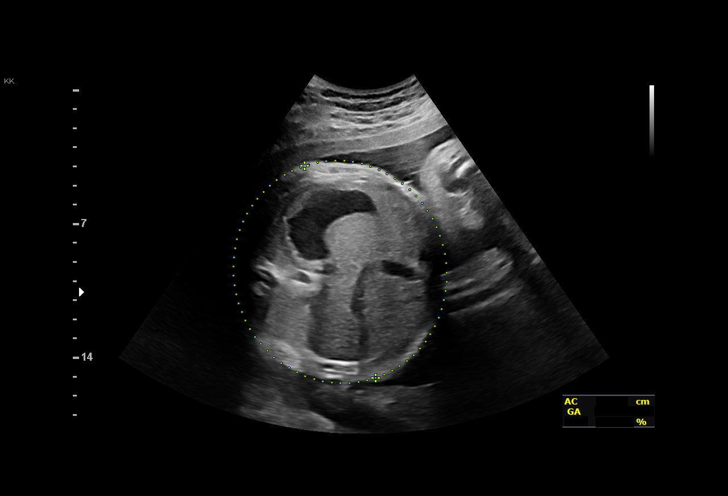
[im 11/42]
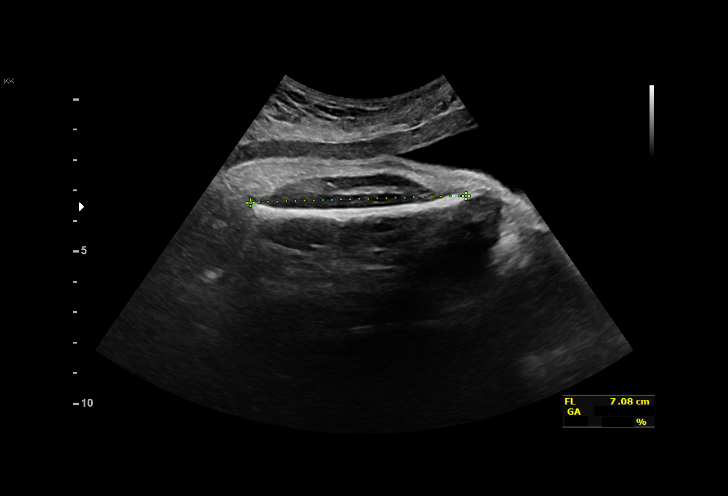
[im 14/42]
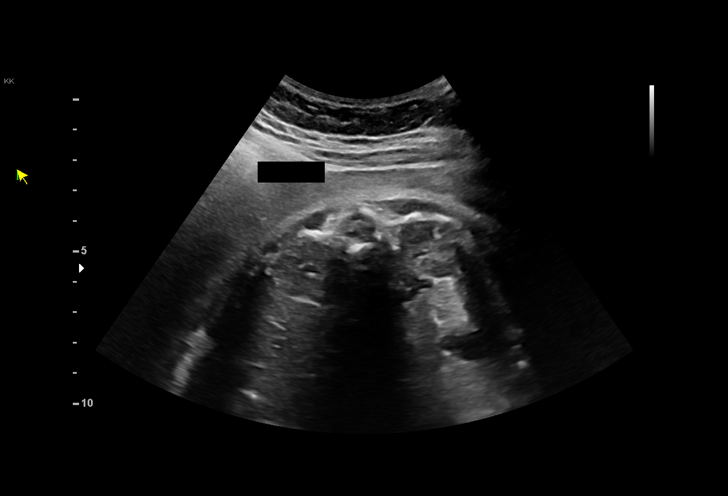
[im 17/42]
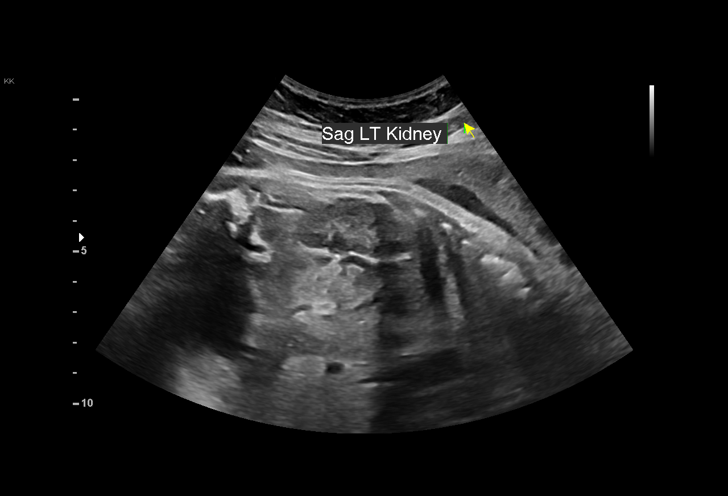
[im 20/42]
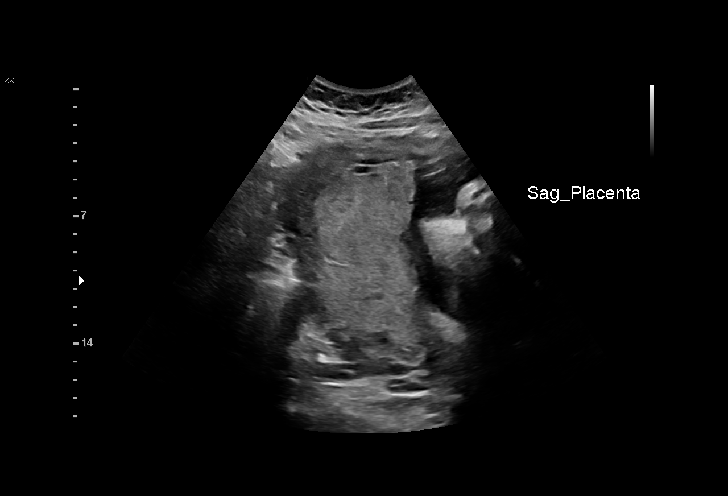
[im 23/42]
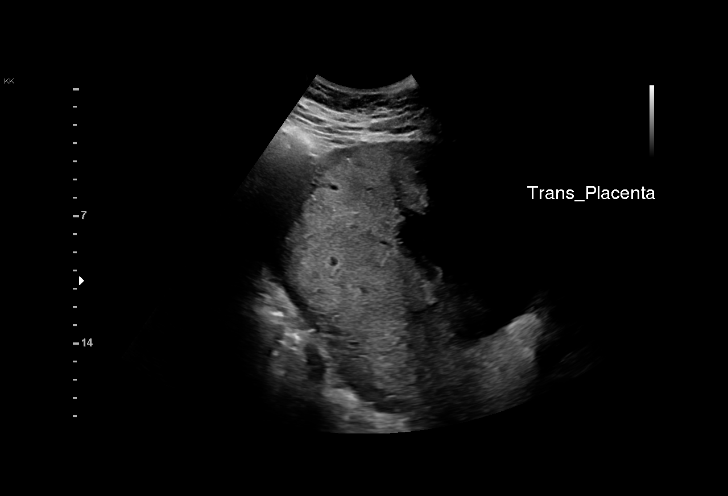
[im 26/42]
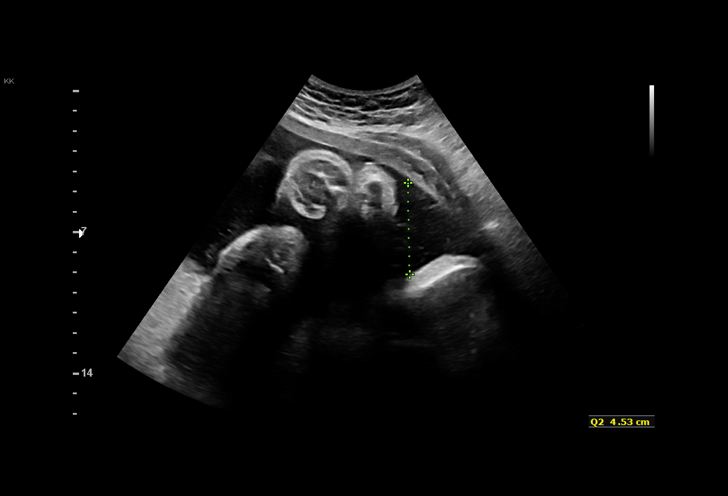
[im 29/42]
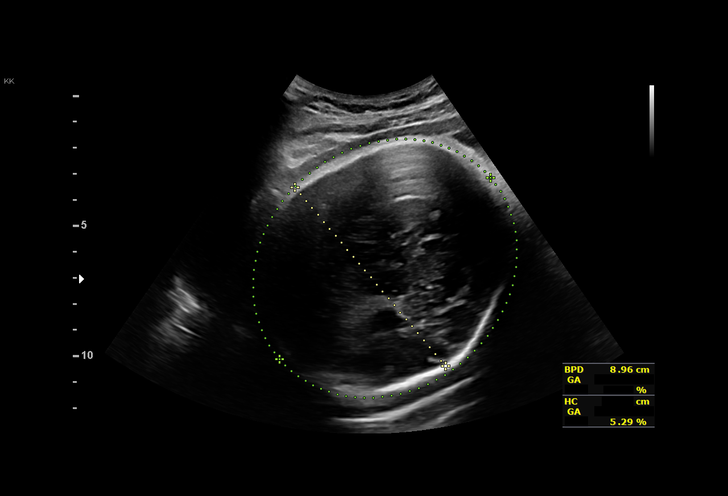
[im 32/42]
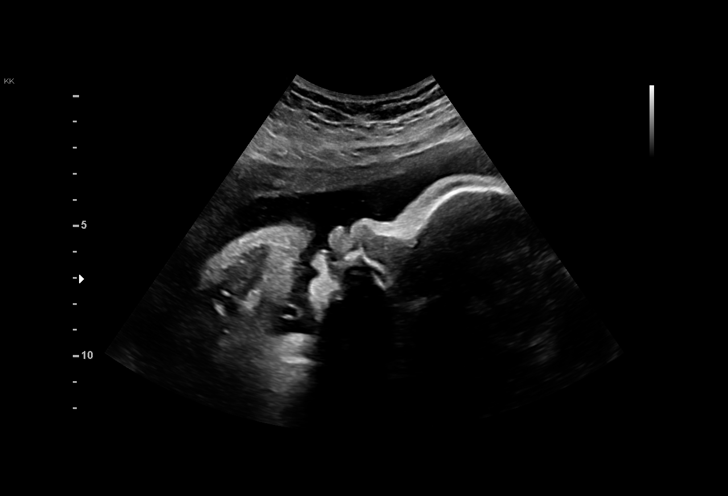
[im 35/42]
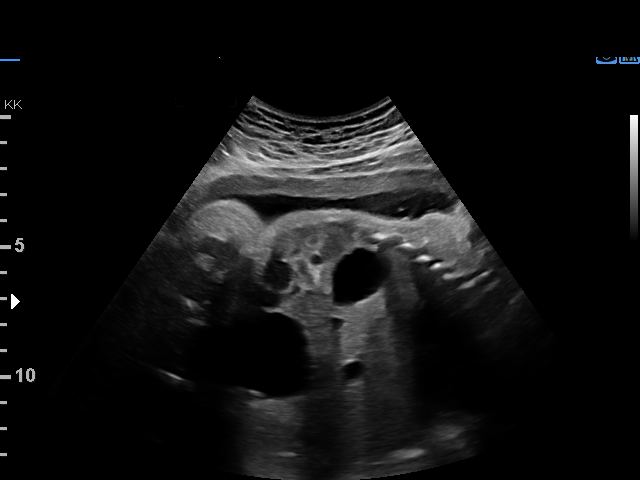
[im 38/42]
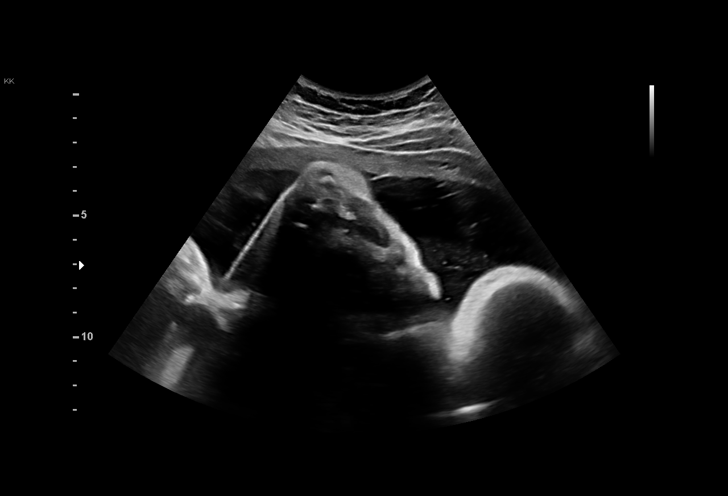
[im 42/42]
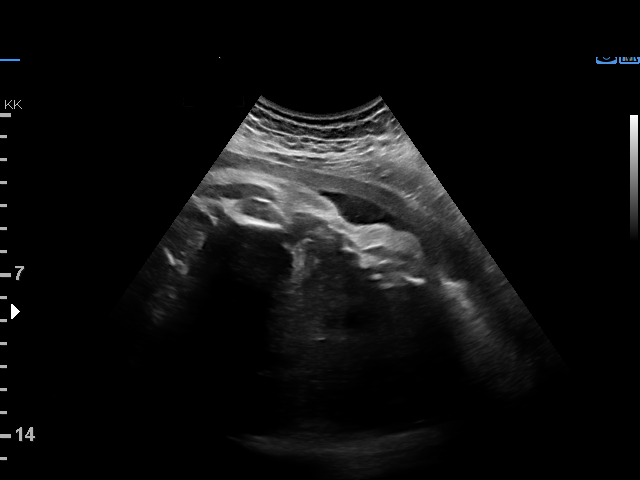

[14 of 28 positions shown; findings below may reference images not displayed]

Indications

Gestational diabetes in pregnancy, diet
controlled
Abnormal biochemical screen (Panorama-
vanishing twin)
Obesity complicating pregnancy, second
trimester (pregravid BMI 32)
Poor obstetric history: Previous preterm
delivery, antepartum (36 wks)
Encounter for other antenatal screening
follow-up
36 weeks gestation of pregnancy
Vital Signs

BMI:
Fetal Evaluation

Num Of Fetuses:         1
Fetal Heart Rate(bpm):  148
Cardiac Activity:       Observed
Presentation:           Cephalic
Placenta:               Posterior
P. Cord Insertion:      Previously Visualized

Amniotic Fluid
AFI FV:      Within normal limits

AFI Sum(cm)     %Tile       Largest Pocket(cm)
16.4            61
RUQ(cm)                     LUQ(cm)        LLQ(cm)
7.82
Biophysical Evaluation

Amniotic F.V:   Pocket => 2 cm two         F. Tone:        Observed
planes
F. Movement:    Observed                   Score:          [DATE]
F. Breathing:   Observed
Biometry

BPD:      89.1  mm     G. Age:  36w 0d         47  %    CI:        79.84   %    70 - 86
FL/HC:      22.8   %    20.8 -
HC:      315.1  mm     G. Age:  35w 2d          6  %    HC/AC:      0.89        0.92 -
AC:      352.5  mm     G. Age:  39w 1d       > 97  %    FL/BPD:     80.8   %    71 - 87
FL:         72  mm     G. Age:  36w 6d         55  %    FL/AC:      20.4   %    20 - 24

Est. FW:    1162  gm      7 lb 5 oz     86  %
OB History

Gravidity:    2         Term:   0        Prem:   1        SAB:   0
TOP:          0       Ectopic:  0        Living: 1
Gestational Age

LMP:           38w 2d        Date:  09/08/17                 EDD:   06/15/18
U/S Today:     36w 6d                                        EDD:   06/25/18
Best:          36w 4d     Det. By:  Early Ultrasound         EDD:   06/27/18
(12/06/17)
Anatomy

Cranium:               Appears normal         LVOT:                   Previously seen
Cavum:                 Previously seen        Aortic Arch:            Previously seen
Ventricles:            Previously seen        Ductal Arch:            Previously seen
Choroid Plexus:        Previously seen        Diaphragm:              Previously seen
Cerebellum:            Previously seen        Stomach:                Appears normal, left
sided
Posterior Fossa:       Previously seen        Abdomen:                Appears normal
Nuchal Fold:           Previously seen        Abdominal Wall:         Previously seen
Face:                  Orbits and profile     Cord Vessels:           Previously seen
previously seen
Lips:                  Previously seen        Kidneys:                Appear normal
Palate:                Previously seen        Bladder:                Appears normal
Thoracic:              Appears normal         Spine:                  Previously seen
Heart:                 Previously seen        Upper Extremities:      Previously seen
RVOT:                  Previously seen        Lower Extremities:      Previously seen

Other:  Parents do not wish to know sex of fetus at this time. Heels, 5th digit,
and Nasal bone previously visualized. Technically difficult due to fetal
position.
Cervix Uterus Adnexa

Cervix
Not visualized (advanced GA >32wks)
Impression

Patient has gestational diabetes that is well-controlled on diet.

Amniotic fluid is normal and good fetal activity is seen. Fetal
growth is appropriate for gestational age. Abdominal
circumference measures at greater than the 95th percentile.
Antenatal testing is reassuring. BPP [DATE].
Recommendations

Continue weekly antenatal testing till delivery.

## 2019-01-24 IMAGING — US US MFM FETAL BPP W/O NON-STRESS
1 series · 12 of 26 positions shown · non-contrast
Comparison: none

[Series 1: us mfm fetal bpp w/o non-stress · 26 acquisitions, 12 frames shown]
[im 2/26]
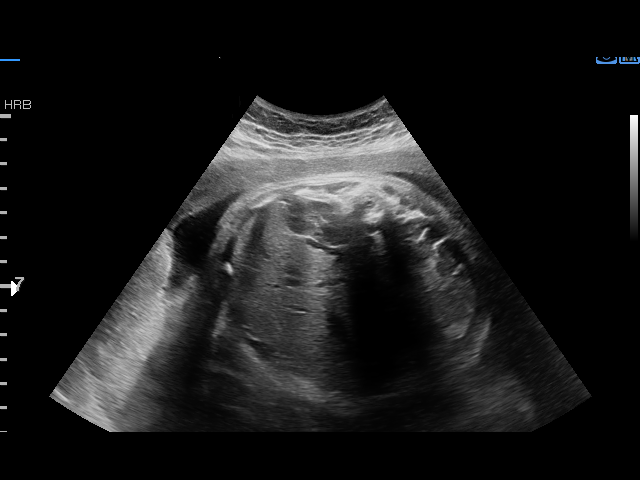
[im 4/26]
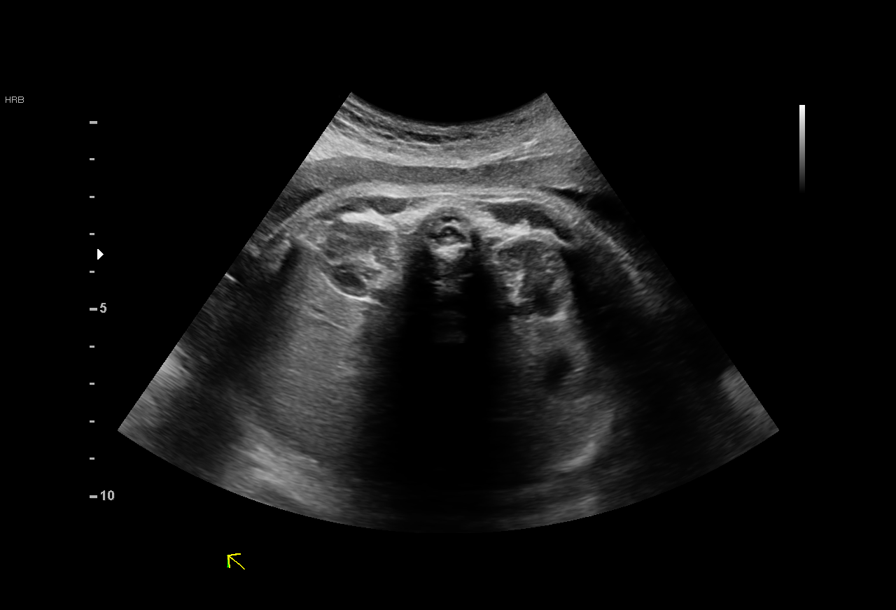
[im 6/26]
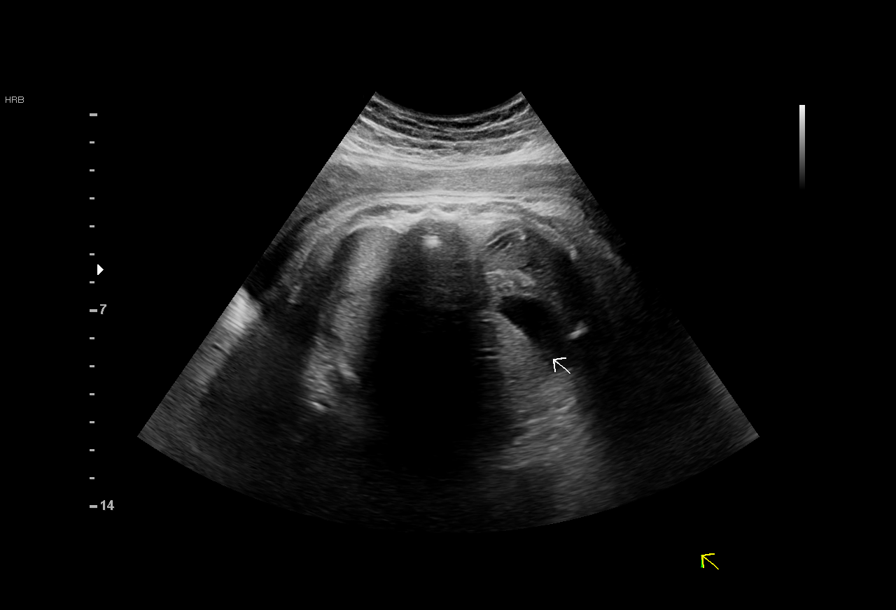
[im 8/26]
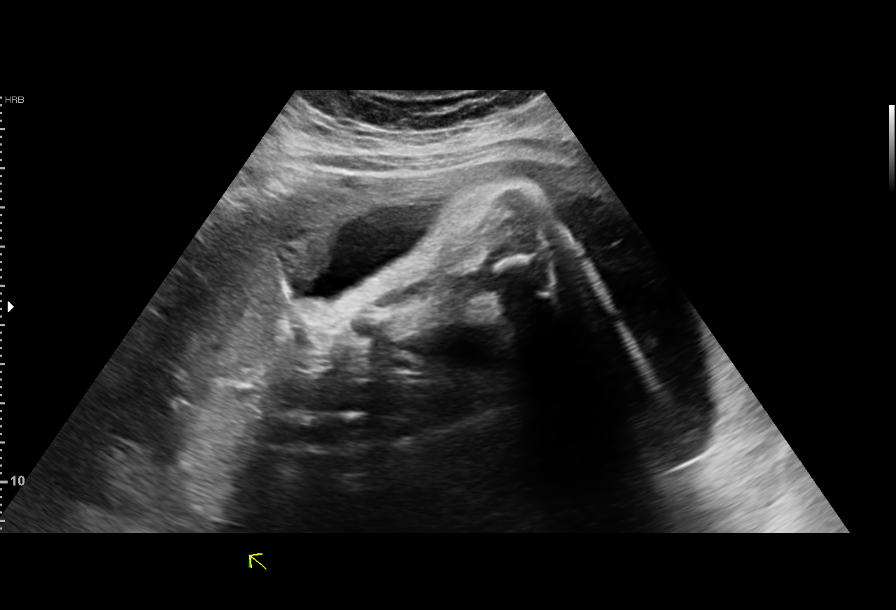
[im 10/26]
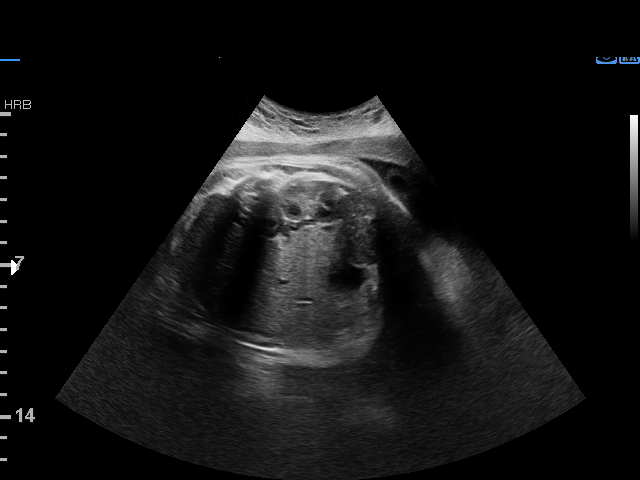
[im 12/26]
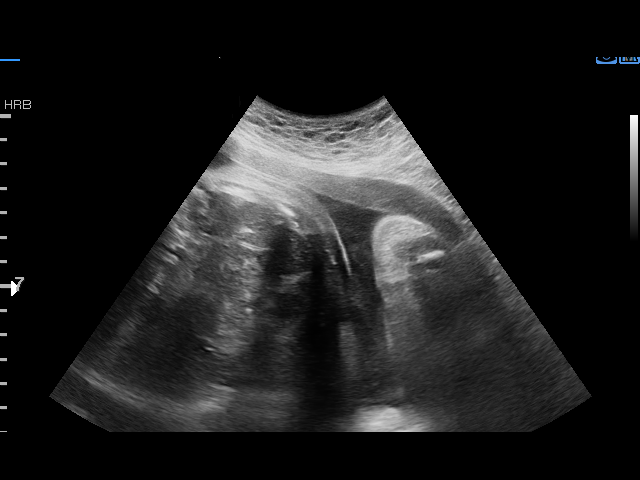
[im 15/26]
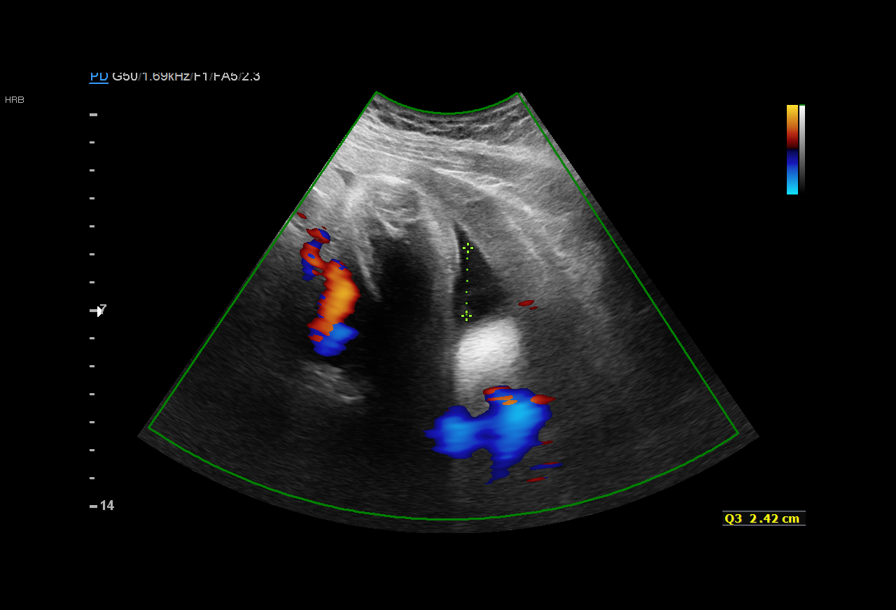
[im 17/26]
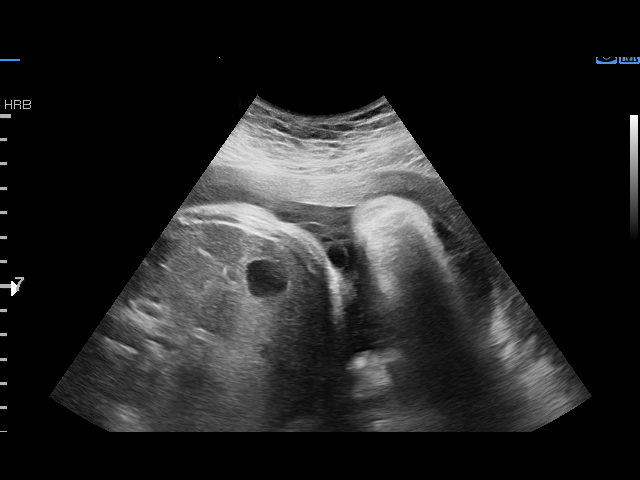
[im 19/26]
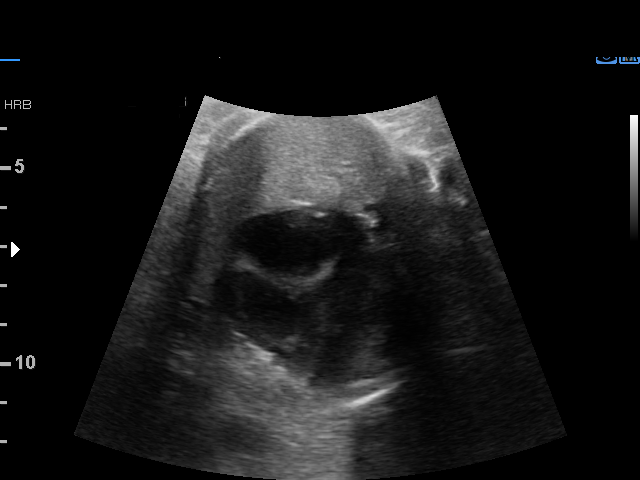
[im 21/26]
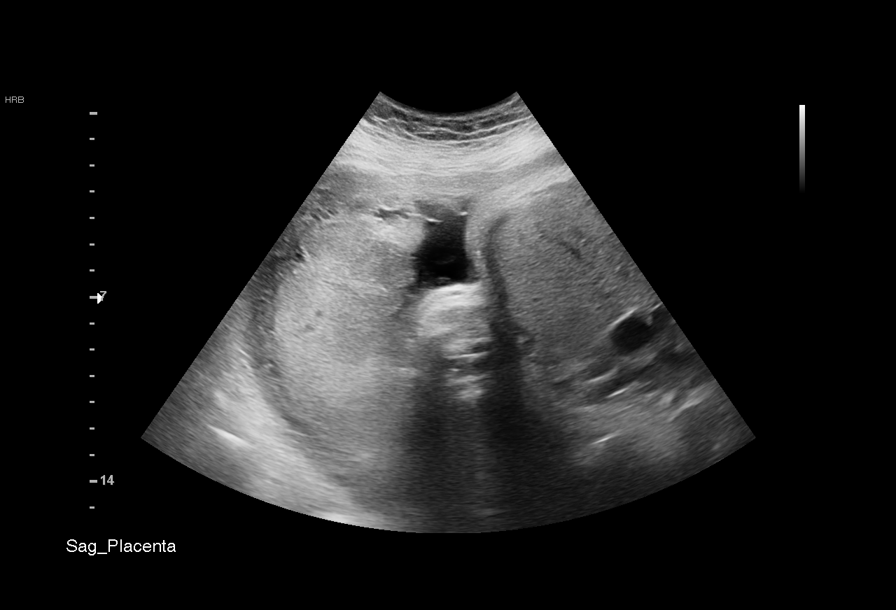
[im 23/26]
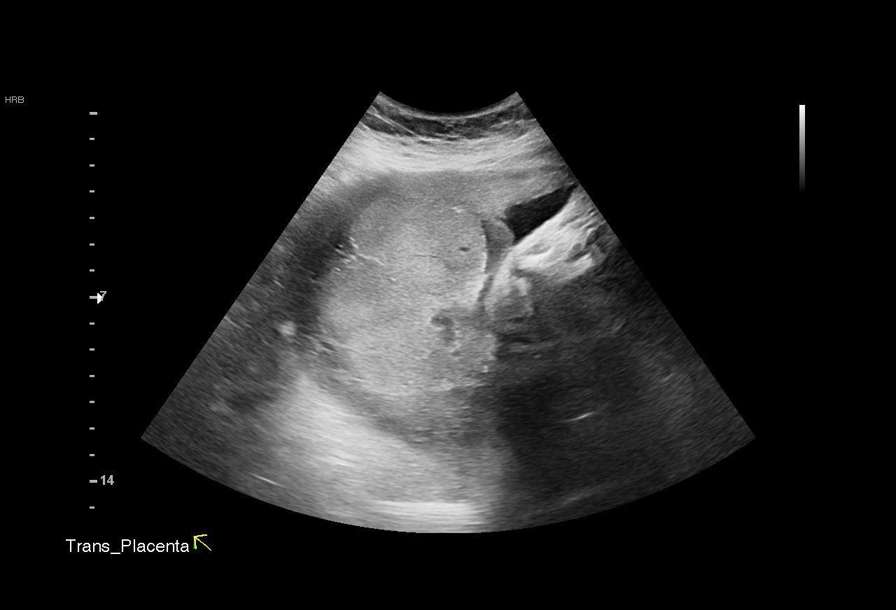
[im 25/26]
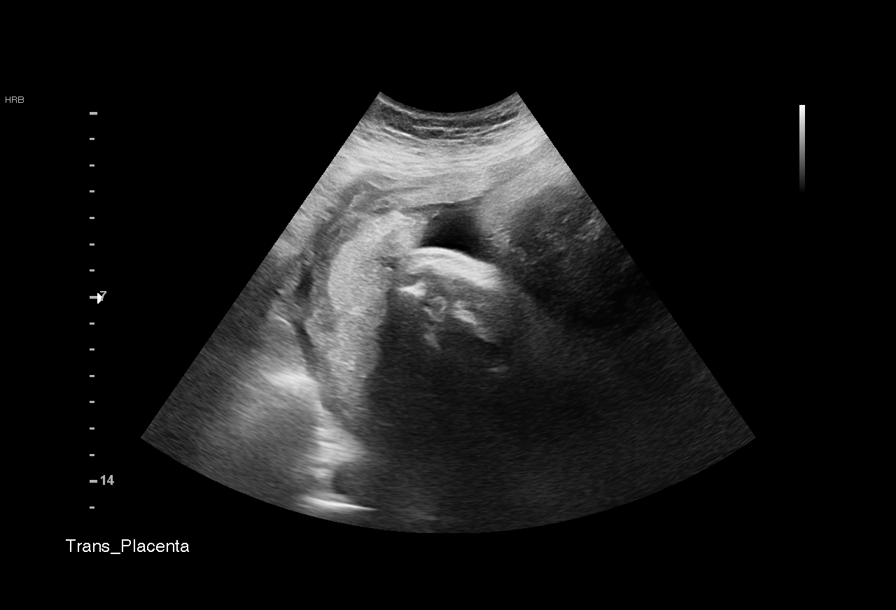

[12 of 26 positions shown; findings below may reference images not displayed]

Indications

Gestational diabetes in pregnancy, diet
controlled
Abnormal biochemical screen (Panorama-
vanishing twin)
Obesity complicating pregnancy, second
trimester (pregravid BMI 32)
Poor obstetric history: Previous preterm
delivery, antepartum (36 wks)
38 weeks gestation of pregnancy
Vital Signs

BMI:
Fetal Evaluation

Num Of Fetuses:         1
Fetal Heart Rate(bpm):  149
Cardiac Activity:       Observed
Presentation:           Cephalic
Placenta:               Posterior
P. Cord Insertion:      Previously Visualized

Amniotic Fluid
AFI FV:      Within normal limits

AFI Sum(cm)     %Tile       Largest Pocket(cm)
12.69           49

RUQ(cm)       RLQ(cm)       LUQ(cm)        LLQ(cm)
2.85
Biophysical Evaluation

Amniotic F.V:   Within normal limits       F. Tone:        Observed
F. Movement:    Observed                   Score:          [DATE]
F. Breathing:   Observed
OB History

Gravidity:    2         Term:   0        Prem:   1        SAB:   0
TOP:          0       Ectopic:  0        Living: 1
Gestational Age

LMP:           40w 3d        Date:  09/08/17                 EDD:   06/15/18
Best:          38w 5d     Det. By:  Early Ultrasound         EDD:   06/27/18
(12/06/17)
Anatomy

Thoracic:              Appears normal         Kidneys:                Appear normal
Stomach:               Appears normal, left   Bladder:                Appears normal
sided
Abdomen:               Appears normal
Cervix Uterus Adnexa

Cervix
Not visualized (advanced GA >70wks)
Impression

Gestational diabetes. Well-controlled on diet.
Amniotic fluid is normal and good fetal activity is seen.
Antenatal testing is reassuring. BPP [DATE].
Recommendations

Antenatal testing next week if undelivered.

## 2019-08-05 ENCOUNTER — Other Ambulatory Visit: Payer: Self-pay

## 2019-08-05 DIAGNOSIS — Z20822 Contact with and (suspected) exposure to covid-19: Secondary | ICD-10-CM

## 2019-08-07 LAB — NOVEL CORONAVIRUS, NAA: SARS-CoV-2, NAA: NOT DETECTED

## 2021-07-07 ENCOUNTER — Encounter: Payer: Self-pay | Admitting: Obstetrics

## 2021-07-07 ENCOUNTER — Other Ambulatory Visit: Payer: Self-pay

## 2021-07-07 ENCOUNTER — Ambulatory Visit (INDEPENDENT_AMBULATORY_CARE_PROVIDER_SITE_OTHER): Payer: Self-pay | Admitting: Obstetrics

## 2021-07-07 VITALS — BP 127/80 | HR 83 | Wt 187.0 lb

## 2021-07-07 DIAGNOSIS — Z01419 Encounter for gynecological examination (general) (routine) without abnormal findings: Secondary | ICD-10-CM

## 2021-07-07 NOTE — Progress Notes (Signed)
Pt would like to get her Nexplanon removed and reinserted today and will come back for her AEX.     Due to insurance issues, pt decided to not keep this appt and reschedule at later date. Pt will contact insurance to work out problems prior to next appt. No exam with provider today.

## 2021-07-07 NOTE — Progress Notes (Signed)
Appointment rescheduled due to insurance problems with coverage.  Brock Bad, MD 07/07/2021 11:58 AM

## 2021-07-20 ENCOUNTER — Ambulatory Visit: Payer: PRIVATE HEALTH INSURANCE | Admitting: Obstetrics

## 2021-07-29 ENCOUNTER — Other Ambulatory Visit: Payer: Self-pay

## 2021-07-29 ENCOUNTER — Ambulatory Visit (INDEPENDENT_AMBULATORY_CARE_PROVIDER_SITE_OTHER): Payer: PRIVATE HEALTH INSURANCE | Admitting: Obstetrics

## 2021-07-29 VITALS — BP 116/78 | HR 77 | Ht 62.0 in | Wt 188.5 lb

## 2021-07-29 DIAGNOSIS — Z30017 Encounter for initial prescription of implantable subdermal contraceptive: Secondary | ICD-10-CM | POA: Diagnosis not present

## 2021-07-29 DIAGNOSIS — Z3046 Encounter for surveillance of implantable subdermal contraceptive: Secondary | ICD-10-CM

## 2021-07-29 MED ORDER — ETONOGESTREL 68 MG ~~LOC~~ IMPL
68.0000 mg | DRUG_IMPLANT | Freq: Once | SUBCUTANEOUS | Status: AC
  Administered 2021-07-29: 68 mg via SUBCUTANEOUS

## 2021-07-29 NOTE — Progress Notes (Addendum)
Nexplanon Procedure Note    PROCEDURE: Nexplanon Removal and Reinsertion Performing Provider: Bing Neighbors. Clearance Coots MD  Patient education prior to procedure, explained risk, benefits of Nexplanon, reviewed alternative options. Patient reported understanding. Gave consent to continue with procedure.  Patient had Nexplanon inserted in 07-25-2018. Desires removal today w/ reinsertion  PROCEDURE:  Pregnancy Text :  not indicated Site (check):      left arm         Sterile Preparation:   Betadinex3 Lot # [id number] Expiration Date 2024AUG16    The patient's left arm was palpated and the implant device located. The area was prepped with Betadinex3. The distal end of the device was palpated and 1.5 cc of 1% lidocaine without epinephrine was injected. A 1.5 mm incision was made. Any fibrotic tissue was carefully dissected away using blunt and/or sharp dissection. The device was removed in an intact manner.   Insertion site was the same as the removal site. Nexplanon  was inserted subcutaneously.Needle was removed from the insertion site. Nexplanon capsule was palpated by provider and patient to assure satisfactory placement. Dressing applied.  Followup: The patient tolerated the procedure well without complications.  Standard post-procedure care is explained and return precautions are given.  Brock Bad, MD 07/29/2021 12:02 PM

## 2021-07-29 NOTE — Progress Notes (Signed)
Nexplanon removal and reinsertion

## 2021-08-02 ENCOUNTER — Other Ambulatory Visit: Payer: Self-pay | Admitting: Obstetrics

## 2021-08-02 ENCOUNTER — Encounter: Payer: Self-pay | Admitting: Obstetrics

## 2021-08-03 ENCOUNTER — Encounter: Payer: Self-pay | Admitting: Obstetrics

## 2021-08-25 NOTE — Progress Notes (Signed)
GYNECOLOGY ANNUAL PREVENTATIVE CARE ENCOUNTER NOTE  History:     Leslie Cook is a 35 y.o. G25P1102 female here for a routine annual gynecologic exam.    Current complaints: none.     Denies abnormal vaginal bleeding, discharge, pelvic pain, problems with intercourse or other gynecologic concerns.     Gynecologic History Patient's last menstrual period was 07/29/2021 (exact date). Contraception: Nexplanon Last Pap: 2019. Result was normal with normal HPV Last Mammogram: never had.   Last Colonoscopy: never had.   Obstetric History OB History  Gravida Para Term Preterm AB Living  2 2 1 1  0 2  SAB IAB Ectopic Multiple Live Births  0 0 0 0 2    # Outcome Date GA Lbr Len/2nd Weight Sex Delivery Anes PTL Lv  2 Term 06/26/18 [redacted]w[redacted]d 06:05 / 00:05 7 lb 0.9 oz (3.2 kg) F Vag-Spont None  LIV  1 Preterm 08/19/13 [redacted]w[redacted]d 21:39 / 00:07 5 lb 15.8 oz (2.716 kg) M Vag-Spont None  LIV    Past Medical History:  Diagnosis Date   Gestational diabetes    Gestational diabetes    Hypertension    Meningitis 2009    Past Surgical History:  Procedure Laterality Date   NO PAST SURGERIES      No current outpatient medications on file prior to visit.   No current facility-administered medications on file prior to visit.    No Known Allergies  Social History:  reports that she quit smoking about 3 years ago. Her smoking use included cigarettes. She has never used smokeless tobacco. She reports that she does not currently use drugs after having used the following drugs: Marijuana. She reports that she does not drink alcohol.  Family History  Problem Relation Age of Onset   Hypertension Mother    Hypertension Father    Diabetes Father    Throat cancer Father    Diabetes Maternal Grandmother     The following portions of the patient's history were reviewed and updated as appropriate: allergies, current medications, past family history, past medical history, past social history,  past surgical history and problem list.  Review of Systems Pertinent items noted in HPI and remainder of comprehensive ROS otherwise negative.  Physical Exam:  LMP 07/29/2021 (Exact Date)  CONSTITUTIONAL: Well-developed, well-nourished female in no acute distress.  HENT:  Normocephalic, atraumatic, External right and left ear normal.  EYES: Conjunctivae and EOM are normal. Pupils are equal, round, and reactive to light. No scleral icterus.  NECK: Normal range of motion, supple, no masses.  Normal thyroid.  SKIN: Skin is warm and dry. No rash noted. Not diaphoretic. No erythema. No pallor. MUSCULOSKELETAL: Normal range of motion. No tenderness.  No cyanosis, clubbing, or edema. NEUROLOGIC: Alert and oriented to person, place, and time. Normal reflexes, muscle tone coordination.  PSYCHIATRIC: Normal mood and affect. Normal behavior. Normal judgment and thought content.  CARDIOVASCULAR: Normal heart rate noted, regular rhythm RESPIRATORY: Clear to auscultation bilaterally. Effort and breath sounds normal, no problems with respiration noted.  BREASTS: Symmetric in size. No masses, tenderness, skin changes, nipple drainage, or lymphadenopathy bilaterally. Performed in the presence of a chaperone. ABDOMEN: Soft, no distention noted.  No tenderness, rebound or guarding.  PELVIC: External genitalia normal, Vagina normal without discharge, Urethra without abnormality or discharge, no bladder tenderness, cervix normal in appearance, no CMT, uterus normal size, shape, and consistency, no adnexal masses or tenderness, exam obscured by obesity. Performed in the presence of a chaperone.  Assessment  and Plan:    1. Encounter for annual routine gynecological examination - Cervical cancer screening: Discussed guidelines. Pap with HPV done  - Gardasil:  She thinks she had it in high school.  - GC/CT: declines - Birth Control:  Nexplanon - Breast Health: Encouraged self breast awareness/SBE. Teaching  provided.  - F/U 12 months and prn  Routine preventative health maintenance measures emphasized. Please refer to After Visit Summary for other counseling recommendations.    Milas Hock, MD, FACOG Obstetrician & Gynecologist, [facility] for [facility], [facility]

## 2021-08-26 ENCOUNTER — Ambulatory Visit (INDEPENDENT_AMBULATORY_CARE_PROVIDER_SITE_OTHER): Payer: PRIVATE HEALTH INSURANCE | Admitting: Obstetrics and Gynecology

## 2021-08-26 ENCOUNTER — Encounter: Payer: Self-pay | Admitting: Obstetrics and Gynecology

## 2021-08-26 ENCOUNTER — Other Ambulatory Visit (HOSPITAL_COMMUNITY)
Admission: RE | Admit: 2021-08-26 | Discharge: 2021-08-26 | Disposition: A | Discharge status: Home / Self Care | Payer: 59 | Source: Ambulatory Visit | Attending: Obstetrics | Admitting: Obstetrics

## 2021-08-26 ENCOUNTER — Other Ambulatory Visit: Payer: Self-pay

## 2021-08-26 VITALS — BP 128/79 | HR 80 | Ht 62.0 in | Wt 185.0 lb

## 2021-08-26 DIAGNOSIS — Z01419 Encounter for gynecological examination (general) (routine) without abnormal findings: Secondary | ICD-10-CM | POA: Diagnosis present

## 2021-09-02 LAB — CYTOLOGY - PAP
Adequacy: ABSENT
Comment: NEGATIVE
Diagnosis: NEGATIVE
High risk HPV: NEGATIVE

## 2023-05-22 ENCOUNTER — Ambulatory Visit: Payer: 59 | Admitting: Sports Medicine

## 2023-06-06 ENCOUNTER — Ambulatory Visit (INDEPENDENT_AMBULATORY_CARE_PROVIDER_SITE_OTHER): Payer: 59 | Admitting: Adult Health

## 2023-06-06 ENCOUNTER — Emergency Department (HOSPITAL_BASED_OUTPATIENT_CLINIC_OR_DEPARTMENT_OTHER): Payer: 59

## 2023-06-06 ENCOUNTER — Encounter: Payer: Self-pay | Admitting: Adult Health

## 2023-06-06 ENCOUNTER — Encounter (HOSPITAL_BASED_OUTPATIENT_CLINIC_OR_DEPARTMENT_OTHER): Payer: Self-pay

## 2023-06-06 ENCOUNTER — Emergency Department (HOSPITAL_BASED_OUTPATIENT_CLINIC_OR_DEPARTMENT_OTHER)
Admission: EM | Admit: 2023-06-06 | Discharge: 2023-06-06 | Disposition: A | Discharge status: Home / Self Care | Payer: 59 | Attending: Emergency Medicine | Admitting: Emergency Medicine

## 2023-06-06 ENCOUNTER — Other Ambulatory Visit: Payer: Self-pay

## 2023-06-06 VITALS — BP 138/86 | HR 70 | Temp 98.9°F | Resp 16 | Ht 62.0 in | Wt 191.0 lb

## 2023-06-06 DIAGNOSIS — Y92410 Unspecified street and highway as the place of occurrence of the external cause: Secondary | ICD-10-CM | POA: Insufficient documentation

## 2023-06-06 DIAGNOSIS — I10 Essential (primary) hypertension: Secondary | ICD-10-CM | POA: Diagnosis not present

## 2023-06-06 DIAGNOSIS — Z0000 Encounter for general adult medical examination without abnormal findings: Secondary | ICD-10-CM

## 2023-06-06 DIAGNOSIS — Z6834 Body mass index (BMI) 34.0-34.9, adult: Secondary | ICD-10-CM

## 2023-06-06 DIAGNOSIS — Z720 Tobacco use: Secondary | ICD-10-CM

## 2023-06-06 DIAGNOSIS — M542 Cervicalgia: Secondary | ICD-10-CM | POA: Diagnosis not present

## 2023-06-06 DIAGNOSIS — R202 Paresthesia of skin: Secondary | ICD-10-CM | POA: Diagnosis not present

## 2023-06-06 DIAGNOSIS — S199XXA Unspecified injury of neck, initial encounter: Secondary | ICD-10-CM | POA: Insufficient documentation

## 2023-06-06 DIAGNOSIS — E669 Obesity, unspecified: Secondary | ICD-10-CM

## 2023-06-06 DIAGNOSIS — Z7689 Persons encountering health services in other specified circumstances: Secondary | ICD-10-CM | POA: Diagnosis not present

## 2023-06-06 DIAGNOSIS — Z113 Encounter for screening for infections with a predominantly sexual mode of transmission: Secondary | ICD-10-CM

## 2023-06-06 DIAGNOSIS — E66811 Obesity, class 1: Secondary | ICD-10-CM

## 2023-06-06 MED ORDER — ACETAMINOPHEN 500 MG PO TABS
1000.0000 mg | ORAL_TABLET | Freq: Once | ORAL | Status: AC
  Administered 2023-06-06: 1000 mg via ORAL
  Filled 2023-06-06: qty 2

## 2023-06-06 MED ORDER — CYCLOBENZAPRINE HCL 10 MG PO TABS: 10.0000 mg | ORAL_TABLET | Freq: Two times a day (BID) | ORAL | 0 refills | Status: DC | PRN

## 2023-06-06 MED ORDER — TIZANIDINE HCL 2 MG PO CAPS
2.0000 mg | ORAL_CAPSULE | Freq: Every evening | ORAL | Status: DC | PRN
Start: 2023-06-06 — End: 2023-06-25

## 2023-06-06 MED ORDER — ICY HOT 5 % EX PTCH
1.0000 | MEDICATED_PATCH | Freq: Every day | CUTANEOUS | Status: DC | PRN
Start: 2023-06-06 — End: 2023-09-28

## 2023-06-06 MED ORDER — NAPROXEN SODIUM 220 MG PO TABS: 220.0000 mg | ORAL_TABLET | Freq: Every day | ORAL | Status: DC | PRN
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
## 2023-06-06 NOTE — Patient Instructions (Addendum)
-  continue ice or warm compress to back of neck 3X/day as needed -  may apply salonpas to back of neck daily as needed -  Preventive Care 35-35 Years Old, Female Preventive care refers to lifestyle choices and visits with your health care provider that can promote health and wellness. Preventive care visits are also called wellness exams. What can I expect for my preventive care visit? Counseling During your preventive care visit, your health care provider may ask about your: Medical history, including: Past medical problems. Family medical history. Pregnancy history. Current health, including: Menstrual cycle. Method of birth control. Emotional well-being. Home life and relationship well-being. Sexual activity and sexual health. Lifestyle, including: Alcohol, nicotine or tobacco, and drug use. Access to firearms. Diet, exercise, and sleep habits. Work and work Astronomer. Sunscreen use. Safety issues such as seatbelt and bike helmet use. Physical exam Your health care provider may check your: Height and weight. These may be used to calculate your BMI (body mass index). BMI is a measurement that tells if you are at a healthy weight. Waist circumference. This measures the distance around your waistline. This measurement also tells if you are at a healthy weight and may help predict your risk of certain diseases, such as type 2 diabetes and high blood pressure. Heart rate and blood pressure. Body temperature. Skin for abnormal spots. What immunizations do I need?  Vaccines are usually given at various ages, according to a schedule. Your health care provider will recommend vaccines for you based on your age, medical history, and lifestyle or other factors, such as travel or where you work. What tests do I need? Screening Your health care provider may recommend screening tests for certain conditions. This may include: Pelvic exam and Pap test. Lipid and cholesterol  levels. Diabetes screening. This is done by checking your blood sugar (glucose) after you have not eaten for a while (fasting). Hepatitis B test. Hepatitis C test. HIV (human immunodeficiency virus) test. STI (sexually transmitted infection) testing, if you are at risk. BRCA-related cancer screening. This may be done if you have a family history of breast, ovarian, tubal, or peritoneal cancers. Talk with your health care provider about your test results, treatment options, and if necessary, the need for more tests. Follow these instructions at home: Eating and drinking  Eat a healthy diet that includes fresh fruits and vegetables, whole grains, lean protein, and low-fat dairy products. Take vitamin and mineral supplements as recommended by your health care provider. Do not drink alcohol if: Your health care provider tells you not to drink. You are pregnant, may be pregnant, or are planning to become pregnant. If you drink alcohol: Limit how much you have to 0-1 drink a day. Know how much alcohol is in your drink. In the [place] one drink equals one 12 oz bottle of beer (355 mL), one 5 oz glass of wine (148 mL), or one 1 oz glass of hard liquor (44 mL). Lifestyle Brush your teeth every morning and night with fluoride toothpaste. Floss one time each day. Exercise for at least 30 minutes 5 or more days each week. Do not use any products that contain nicotine or tobacco. These products include cigarettes, chewing tobacco, and vaping devices, such as e-cigarettes. If you need help quitting, ask your health care provider. Do not use drugs. If you are sexually active, practice safe sex. Use a condom or other form of protection to prevent STIs. If you do not wish to become pregnant, use a  form of birth control. If you plan to become pregnant, see your health care provider for a prepregnancy visit. Find healthy ways to manage stress, such as: Meditation, yoga, or listening to  music. Journaling. Talking to a trusted person. Spending time with friends and family. Minimize exposure to UV radiation to reduce your risk of skin cancer. Safety Always wear your seat belt while driving or riding in a vehicle. Do not drive: If you have been drinking alcohol. Do not ride with someone who has been drinking. If you have been using any mind-altering substances or drugs. While texting. When you are tired or distracted. Wear a helmet and other protective equipment during sports activities. If you have firearms in your house, make sure you follow all gun safety procedures. Seek help if you have been physically or sexually abused. What's next? Go to your health care provider once a year for an annual wellness visit. Ask your health care provider how often you should have your eyes and teeth checked. Stay up to date on all vaccines. This information is not intended to replace advice given to you by your health care provider. Make sure you discuss any questions you have with your health care provider. Document Revised: 03/23/2021 Document Reviewed: 03/23/2021 Elsevier Patient Education  2024 [employer].

## 2023-06-06 NOTE — Discharge Instructions (Addendum)
Your CT scan today was reassuring. Please take tylenol/ibuprofen or Flexeril for pain. I recommend close follow-up with PCP for reevaluation.  Please do not hesitate to return to emergency department if worrisome signs symptoms we discussed become apparent.
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
## 2023-06-06 NOTE — ED Notes (Signed)
Reviewed AVS with patient, patient expressed understanding of directions, denies further questions at this time. 
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
## 2023-06-07 ENCOUNTER — Other Ambulatory Visit: Payer: 59

## 2023-06-11 ENCOUNTER — Emergency Department (HOSPITAL_BASED_OUTPATIENT_CLINIC_OR_DEPARTMENT_OTHER)
Admission: EM | Admit: 2023-06-11 | Discharge: 2023-06-11 | Disposition: A | Discharge status: Home / Self Care | Payer: 59 | Source: Home / Self Care | Attending: Emergency Medicine | Admitting: Emergency Medicine

## 2023-06-11 ENCOUNTER — Encounter (HOSPITAL_BASED_OUTPATIENT_CLINIC_OR_DEPARTMENT_OTHER): Payer: Self-pay | Admitting: Emergency Medicine

## 2023-06-11 ENCOUNTER — Other Ambulatory Visit: Payer: Self-pay

## 2023-06-11 ENCOUNTER — Emergency Department (HOSPITAL_BASED_OUTPATIENT_CLINIC_OR_DEPARTMENT_OTHER): Payer: 59

## 2023-06-11 DIAGNOSIS — Y92410 Unspecified street and highway as the place of occurrence of the external cause: Secondary | ICD-10-CM | POA: Insufficient documentation

## 2023-06-11 DIAGNOSIS — M25511 Pain in right shoulder: Secondary | ICD-10-CM | POA: Insufficient documentation

## 2023-06-11 DIAGNOSIS — R202 Paresthesia of skin: Secondary | ICD-10-CM | POA: Diagnosis not present

## 2023-06-11 DIAGNOSIS — E119 Type 2 diabetes mellitus without complications: Secondary | ICD-10-CM | POA: Insufficient documentation

## 2023-06-11 DIAGNOSIS — I10 Essential (primary) hypertension: Secondary | ICD-10-CM | POA: Insufficient documentation

## 2023-06-11 MED ORDER — LIDOCAINE 4 % EX PTCH: 1.0000 | MEDICATED_PATCH | CUTANEOUS | 0 refills | Status: DC

## 2023-06-11 MED ORDER — LIDOCAINE 5 % EX PTCH
1.0000 | MEDICATED_PATCH | CUTANEOUS | Status: DC
  Administered 2023-06-11: 1 via TRANSDERMAL
  Filled 2023-06-11: qty 1

## 2023-06-11 MED ORDER — NAPROXEN 500 MG PO TABS
500.0000 mg | ORAL_TABLET | Freq: Two times a day (BID) | ORAL | 0 refills | Status: DC
Start: 2023-06-11 — End: 2023-09-28
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
## 2023-06-11 NOTE — Discharge Instructions (Addendum)
Please take your medications as prescribed. Take tylenol/naproxen for pain. I recommend close follow-up with orthopedics for reevaluation.  Please do not hesitate to return to emergency department if worrisome signs symptoms we discussed become apparent.

## 2023-06-14 ENCOUNTER — Other Ambulatory Visit: Payer: 59

## 2023-06-14 DIAGNOSIS — Z113 Encounter for screening for infections with a predominantly sexual mode of transmission: Secondary | ICD-10-CM

## 2023-06-14 DIAGNOSIS — Z0000 Encounter for general adult medical examination without abnormal findings: Secondary | ICD-10-CM

## 2023-06-15 ENCOUNTER — Other Ambulatory Visit: Payer: Self-pay | Admitting: Orthopedic Surgery

## 2023-06-15 ENCOUNTER — Other Ambulatory Visit: Payer: Self-pay

## 2023-06-15 ENCOUNTER — Telehealth: Payer: Self-pay

## 2023-06-15 DIAGNOSIS — R7303 Prediabetes: Secondary | ICD-10-CM

## 2023-06-15 DIAGNOSIS — E7800 Pure hypercholesterolemia, unspecified: Secondary | ICD-10-CM

## 2023-06-15 LAB — CBC WITH DIFFERENTIAL/PLATELET
Absolute Monocytes: 443 {cells}/uL (ref 200–950)
Basophils Absolute: 22 {cells}/uL (ref 0–200)
Basophils Relative: 0.4 %
Eosinophils Absolute: 59 {cells}/uL (ref 15–500)
Eosinophils Relative: 1.1 %
HCT: 41.5 % (ref 35.0–45.0)
Hemoglobin: 13.7 g/dL (ref 11.7–15.5)
Lymphs Abs: 1420 {cells}/uL (ref 850–3900)
MCH: 29.5 pg (ref 27.0–33.0)
MCHC: 33 g/dL (ref 32.0–36.0)
MCV: 89.2 fL (ref 80.0–100.0)
MPV: 9.6 fL (ref 7.5–12.5)
Monocytes Relative: 8.2 %
Neutro Abs: 3456 {cells}/uL (ref 1500–7800)
Neutrophils Relative %: 64 %
Platelets: 326 10*3/uL (ref 140–400)
RBC: 4.65 10*6/uL (ref 3.80–5.10)
RDW: 11.9 % (ref 11.0–15.0)
Total Lymphocyte: 26.3 %
WBC: 5.4 10*3/uL (ref 3.8–10.8)

## 2023-06-15 LAB — COMPLETE METABOLIC PANEL WITH GFR
AG Ratio: 1.2 (calc) (ref 1.0–2.5)
ALT: 19 U/L (ref 6–29)
AST: 21 U/L (ref 10–30)
Albumin: 4.1 g/dL (ref 3.6–5.1)
Alkaline phosphatase (APISO): 63 U/L (ref 31–125)
BUN: 9 mg/dL (ref 7–25)
CO2: 25 mmol/L (ref 20–32)
Calcium: 9.3 mg/dL (ref 8.6–10.2)
Chloride: 104 mmol/L (ref 98–110)
Creat: 0.85 mg/dL (ref 0.50–0.97)
Globulin: 3.5 g/dL (ref 1.9–3.7)
Glucose, Bld: 92 mg/dL (ref 65–99)
Potassium: 4.4 mmol/L (ref 3.5–5.3)
Sodium: 137 mmol/L (ref 135–146)
Total Bilirubin: 0.3 mg/dL (ref 0.2–1.2)
Total Protein: 7.6 g/dL (ref 6.1–8.1)
eGFR: 92 mL/min/{1.73_m2} (ref >=60)

## 2023-06-15 LAB — LIPID PANEL
Cholesterol: 255 mg/dL — ABNORMAL HIGH
HDL: 55 mg/dL
LDL Cholesterol (Calc): 176 mg/dL — ABNORMAL HIGH
Non-HDL Cholesterol (Calc): 200 mg/dL — ABNORMAL HIGH
Total CHOL/HDL Ratio: 4.6 (calc)
Triglycerides: 112 mg/dL

## 2023-06-15 LAB — HEMOGLOBIN A1C
Hgb A1c MFr Bld: 5.9 %{Hb} — ABNORMAL HIGH (ref <5.7)
Mean Plasma Glucose: 123 mg/dL
eAG (mmol/L): 6.8 mmol/L

## 2023-06-15 LAB — HEPATITIS C ANTIBODY: Hepatitis C Ab: NONREACTIVE

## 2023-06-15 NOTE — Transitions of Care (Post Inpatient/ED Visit) (Signed)
   06/15/2023  Name: Leslie Cook MRN: [id number] DOB: [birth year]  Today's TOC FU Call Status: Today's TOC FU Call Status:: Successful TOC FU Call Completed TOC FU Call Complete Date: 06/15/23 Patient's Name and Date of Birth confirmed.  Transition Care Management Follow-up Telephone Call    Items Reviewed: Did you receive and understand the discharge instructions provided?: Yes Medications obtained,verified, and reconciled?: Yes (Medications Reviewed) Any new allergies since your discharge?: No Dietary orders reviewed?: No Do you have support at home?: Yes Name of Support/Comfort Primary Source: Leslie Cook  Medications Reviewed Today: Medications Reviewed Today     Reviewed by Dicky Doe, CMA (Certified Medical Assistant) on 06/15/23 at 1401  Med List Status: <None>   Medication Order Taking? Sig Documenting Provider Last Dose Status Informant  cyclobenzaprine (FLEXERIL) 10 MG tablet [id number] Yes Take 1 tablet (10 mg total) by mouth 2 (two) times daily as needed for muscle spasms. Jeanelle Malling, PA Taking Active   lidocaine (HM LIDOCAINE PATCH) 4 % [id number] Yes Place 1 patch onto the skin daily. Jeanelle Malling, PA Taking Active   Menthol, Topical Analgesic, (ICY HOT) 5 % PTCH [id number] Yes Apply 1 patch topically daily as needed. Medina-Vargas, Monina C, NP Taking Active   naproxen (NAPROSYN) 500 MG tablet [id number] Yes Take 1 tablet (500 mg total) by mouth 2 (two) times daily. Jeanelle Malling, PA Taking Active   naproxen sodium (ALEVE) 220 MG tablet [id number] Yes Take 1 tablet (220 mg total) by mouth daily as needed. Take with food Medina-Vargas, Monina C, NP Taking Active   tizanidine (ZANAFLEX) 2 MG capsule [id number] Yes Take 1 capsule (2 mg total) by mouth at bedtime as needed for muscle spasms. Medina-Vargas, Monina C, NP Taking Active             Home Care and Equipment/Supplies: Were Home Health Services Ordered?: No Any new equipment or medical supplies ordered?:  No  Functional Questionnaire: Do you need assistance with bathing/showering or dressing?: No Do you need assistance with meal preparation?: No Do you need assistance with eating?: No Do you have difficulty maintaining continence: No Do you need assistance with getting out of bed/getting out of a chair/moving?: No Do you have difficulty managing or taking your medications?: No  Follow up appointments reviewed: PCP Follow-up appointment confirmed?: Yes Date of PCP follow-up appointment?: 06/25/23 Follow-up Provider: Ella Bodo, NP Specialist [facility] Follow-up appointment confirmed?: No Do you need transportation to your follow-up appointment?: No Do you understand care options if your condition(s) worsen?: Yes-patient verbalized understanding    SIGNATURE: [name].D/RMA

## 2023-06-25 ENCOUNTER — Ambulatory Visit (INDEPENDENT_AMBULATORY_CARE_PROVIDER_SITE_OTHER): Payer: 59 | Admitting: Adult Health

## 2023-06-25 ENCOUNTER — Encounter: Payer: Self-pay | Admitting: Adult Health

## 2023-06-25 VITALS — BP 121/78 | HR 74 | Temp 97.9°F | Resp 18 | Ht 62.0 in | Wt 189.8 lb

## 2023-06-25 DIAGNOSIS — E669 Obesity, unspecified: Secondary | ICD-10-CM

## 2023-06-25 DIAGNOSIS — R7303 Prediabetes: Secondary | ICD-10-CM | POA: Diagnosis not present

## 2023-06-25 DIAGNOSIS — E782 Mixed hyperlipidemia: Secondary | ICD-10-CM | POA: Diagnosis not present

## 2023-06-25 DIAGNOSIS — M79601 Pain in right arm: Secondary | ICD-10-CM

## 2023-06-25 DIAGNOSIS — Z6834 Body mass index (BMI) 34.0-34.9, adult: Secondary | ICD-10-CM
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
## 2023-06-26 ENCOUNTER — Telehealth: Payer: Self-pay

## 2023-06-26 LAB — VITAMIN D 25 HYDROXY (VIT D DEFICIENCY, FRACTURES): Vit D, 25-Hydroxy: 34 ng/mL (ref 30–100)

## 2023-06-26 NOTE — Progress Notes (Signed)
Vitamin D level within normal

## 2023-06-26 NOTE — Telephone Encounter (Signed)
-----   Message from Marshall Medina-Vargas sent at 06/26/2023 12:13 PM EDT ----- Vitamin D level within normal

## 2023-09-14 ENCOUNTER — Other Ambulatory Visit: Payer: 59

## 2023-09-14 DIAGNOSIS — E7800 Pure hypercholesterolemia, unspecified: Secondary | ICD-10-CM

## 2023-09-14 DIAGNOSIS — R7303 Prediabetes: Secondary | ICD-10-CM

## 2023-09-15 LAB — LIPID PANEL
Cholesterol: 212 mg/dL — ABNORMAL HIGH (ref <200)
HDL: 53 mg/dL (ref >=50)
LDL Cholesterol (Calc): 144 mg/dL — ABNORMAL HIGH
Non-HDL Cholesterol (Calc): 159 mg/dL — ABNORMAL HIGH (ref <130)
Total CHOL/HDL Ratio: 4 (calc) (ref <5.0)
Triglycerides: 55 mg/dL (ref <150)

## 2023-09-15 LAB — HEMOGLOBIN A1C
Hgb A1c MFr Bld: 5.6 %{Hb} (ref <5.7)
Mean Plasma Glucose: 114 mg/dL
eAG (mmol/L): 6.3 mmol/L

## 2023-09-20 NOTE — Progress Notes (Signed)
-    cholesterol 212, down from 255 -  LDL 144, down from 176 -  U2V now normal, 5.6 down from 5.9 -  continue to exercise, increase vegetable and low fat diet

## 2023-09-28 ENCOUNTER — Ambulatory Visit (INDEPENDENT_AMBULATORY_CARE_PROVIDER_SITE_OTHER): Payer: 59 | Admitting: Adult Health

## 2023-09-28 VITALS — BP 121/78 | HR 71 | Temp 98.4°F | Resp 18 | Ht 62.0 in | Wt 172.6 lb

## 2023-09-28 DIAGNOSIS — Z6831 Body mass index (BMI) 31.0-31.9, adult: Secondary | ICD-10-CM

## 2023-09-28 DIAGNOSIS — R7303 Prediabetes: Secondary | ICD-10-CM

## 2023-09-28 DIAGNOSIS — E782 Mixed hyperlipidemia: Secondary | ICD-10-CM | POA: Diagnosis not present

## 2023-09-28 DIAGNOSIS — E66811 Obesity, class 1: Secondary | ICD-10-CM

## 2023-09-28 DIAGNOSIS — E559 Vitamin D deficiency, unspecified: Secondary | ICD-10-CM | POA: Diagnosis not present

## 2023-09-28 MED ORDER — VITAMIN D3 50 MCG (2000 UT) PO CAPS: 2000.0000 [IU] | ORAL_CAPSULE | Freq: Every day | ORAL | Status: DC

## 2023-09-28 NOTE — Progress Notes (Unsigned)
[facility] clinic  Provider:   Code Status: *** Goals of Care:     09/28/2023    3:11 PM  Advanced Directives  Does Patient Have a Medical Advance Directive? No  Would patient like information on creating a medical advance directive? No - Patient declined     Chief Complaint  Patient presents with   Medical Management of Chronic Issues    3 months follow-up   Immunizations    Covid    HPI: Patient is a 37 y.o. female seen today for an acute visit for  Had 19 lbs weight loss in 4 months. Ate 1500 cal/day, exercise, cut the sweets, processed foods and carbs  Weight loss of 25 lbs in 6 months  Has Nextplanon on left upper arm, 2025 its due for changing, goes to [place] at [employer].  Has 2 COVID-19 vaccines  Wt Readings from Last 3 Encounters:  09/28/23 172 lb 9.6 oz (78.3 kg)  06/25/23 189 lb 12.8 oz (86.1 kg)  06/06/23 191 lb (86.6 kg)     Past Medical History:  Diagnosis Date   Gestational diabetes    Gestational diabetes    Hypertension    Meningitis 2009    Past Surgical History:  Procedure Laterality Date   NO PAST SURGERIES      No Known Allergies  Outpatient Encounter Medications as of 09/28/2023  Medication Sig   [DISCONTINUED] lidocaine (HM LIDOCAINE PATCH) 4 % Place 1 patch onto the skin daily.   [DISCONTINUED] Menthol, Topical Analgesic, (ICY HOT) 5 % PTCH Apply 1 patch topically daily as needed.   [DISCONTINUED] naproxen (NAPROSYN) 500 MG tablet Take 1 tablet (500 mg total) by mouth 2 (two) times daily.   No facility-administered encounter medications on file as of 09/28/2023.    Review of Systems:  Review of Systems  Constitutional:  Negative for appetite change, chills, fatigue and fever.  HENT:  Negative for congestion, hearing loss, rhinorrhea and sore throat.   Eyes: Negative.   Respiratory:  Negative for cough, shortness of breath and wheezing.   Cardiovascular:  Negative for chest pain, palpitations and leg swelling.   Gastrointestinal:  Negative for abdominal pain, constipation, diarrhea, nausea and vomiting.  Genitourinary:  Negative for dysuria.  Musculoskeletal:  Negative for arthralgias, back pain and myalgias.  Skin:  Negative for color change, rash and wound.  Neurological:  Negative for dizziness, weakness and headaches.  Psychiatric/Behavioral:  Negative for behavioral problems. The patient is not nervous/anxious.     Health Maintenance  Topic Date Due   COVID-19 Vaccine (1 - 2024-25 season) Never done   INFLUENZA VACCINE  01/07/2024 (Originally 05/10/2023)   Cervical Cancer Screening (HPV/Pap Cotest)  08/26/2026   DTaP/Tdap/Td (3 - Td or Tdap) 06/19/2028   Hepatitis C Screening  Completed   HIV Screening  Completed   HPV VACCINES  Aged Out    Physical Exam: Vitals:   09/28/23 1503  BP: 121/78  Pulse: 71  Resp: 18  Temp: 98.4 F (36.9 C)  SpO2: 99%  Weight: 172 lb 9.6 oz (78.3 kg)  Height: 5\' 2"  (1.575 m)   Body mass index is 31.57 kg/m. Physical Exam Constitutional:      General: She is not in acute distress.    Appearance: Normal appearance.  HENT:     Head: Normocephalic and atraumatic.     Nose: Nose normal.     Mouth/Throat:     Mouth: Mucous membranes are moist.  Eyes:     Conjunctiva/sclera:  Conjunctivae normal.  Cardiovascular:     Rate and Rhythm: Normal rate and regular rhythm.  Pulmonary:     Effort: Pulmonary effort is normal.     Breath sounds: Normal breath sounds.  Abdominal:     General: Bowel sounds are normal.     Palpations: Abdomen is soft.  Musculoskeletal:        General: Normal range of motion.     Cervical back: Normal range of motion.  Skin:    General: Skin is warm and dry.  Neurological:     General: No focal deficit present.     Mental Status: She is alert and oriented to person, place, and time.  Psychiatric:        Mood and Affect: Mood normal.        Behavior: Behavior normal.        Thought Content: Thought content normal.         Judgment: Judgment normal.     Labs reviewed: Basic Metabolic Panel: Recent Labs    06/14/23 0807  NA 137  K 4.4  CL 104  CO2 25  GLUCOSE 92  BUN 9  CREATININE 0.85  CALCIUM 9.3   Liver Function Tests: Recent Labs    06/14/23 0807  AST 21  ALT 19  BILITOT 0.3  PROT 7.6   No results for input(s): "LIPASE", "AMYLASE" in the last 8760 hours. No results for input(s): "AMMONIA" in the last 8760 hours. CBC: Recent Labs    06/14/23 0807  WBC 5.4  NEUTROABS 3,456  HGB 13.7  HCT 41.5  MCV 89.2  PLT 326   Lipid Panel: Recent Labs    06/14/23 0807 09/14/23 0805  CHOL 255* 212*  HDL 55 53  LDLCALC 176* 144*  TRIG 112 55  CHOLHDL 4.6 4.0   Lab Results  Component Value Date   HGBA1C 5.6 09/14/2023    Procedures since last visit: No results found.  Assessment/Plan    Labs/tests ordered:  None  Next appt:  Visit date not found

## 2024-03-14 ENCOUNTER — Ambulatory Visit: Payer: 59 | Admitting: Adult Health

## 2024-03-14 ENCOUNTER — Ambulatory Visit: Payer: Self-pay | Admitting: Adult Health

## 2024-04-18 ENCOUNTER — Ambulatory Visit: Payer: Self-pay | Admitting: Adult Health

## 2024-04-18 VITALS — BP 118/75 | HR 67 | Temp 97.8°F | Resp 18 | Ht 62.0 in | Wt 168.4 lb

## 2024-04-18 DIAGNOSIS — R7303 Prediabetes: Secondary | ICD-10-CM

## 2024-04-18 DIAGNOSIS — E559 Vitamin D deficiency, unspecified: Secondary | ICD-10-CM

## 2024-04-18 DIAGNOSIS — E782 Mixed hyperlipidemia: Secondary | ICD-10-CM | POA: Diagnosis not present

## 2024-04-18 NOTE — Progress Notes (Unsigned)
 [facility] clinic  Provider:  Jereld Serum DNP  Code Status:  Full Code  Goals of Care:     09/28/2023    3:11 PM  Advanced Directives  Does Patient Have a Medical Advance Directive? No  Would patient like information on creating a medical advance directive? No - Patient declined     Chief Complaint  Patient presents with   Medical Management of Chronic Issues     6 mths follow up/ lab work     Discussed the use of AI scribe software for clinical note transcription with the patient, who gave verbal consent to proceed.  HPI: Patient is a 38 y.o. female seen today for a 76-month follow up of chronic medical issues.  She has experienced a weight decrease from 189 pounds in September to 168 pounds currently, attributed to increased physical activity, including walking every day for at least 30 minutes. She walks less on weekends due to family commitments. No weight gain since the last visit.  She is not currently taking any medications, including vitamin D , which she previously used. Her cholesterol levels have improved over the past ten months, with total cholesterol decreasing from 255 mg/dL to 787 mg/dL, and LDL cholesterol decreasing from 176 mg/dL to 855 mg/dL.  Her A1c was previously elevated ten months ago and was 5.6% seven months ago. She maintains a diet with a larger portion of vegetables and snacks on fruits, aiming to keep her carbohydrate and calorie intake down.  She has a Nexplanon  implant in her left arm.  She occasionally consumes alcohol, approximately two drinks per month, preferring sweet wines like [place] and Elora Ee. Her vitamin D  levels were low six years ago but were sufficient at 34 ng/mL nine months ago. She has two children, aged ten and five, who keep her busy on weekends.  No current pain or mishaps. Regular bowel movements every other day. No concerns with her current health status.    Past Medical History:  Diagnosis Date   Gestational  diabetes    Gestational diabetes    Hypertension    Meningitis 2009    Past Surgical History:  Procedure Laterality Date   NO PAST SURGERIES      No Known Allergies  Outpatient Encounter Medications as of 04/18/2024  Medication Sig   [DISCONTINUED] Cholecalciferol (VITAMIN D3) 50 MCG (2000 UT) capsule Take 1 capsule (2,000 Units total) by mouth daily.   No facility-administered encounter medications on file as of 04/18/2024.    Review of Systems:  Review of Systems  Constitutional:  Negative for appetite change, chills, fatigue and fever.  HENT:  Negative for congestion, hearing loss, rhinorrhea and sore throat.   Eyes: Negative.   Respiratory:  Negative for cough, shortness of breath and wheezing.   Cardiovascular:  Negative for chest pain, palpitations and leg swelling.  Gastrointestinal:  Negative for abdominal pain, constipation, diarrhea, nausea and vomiting.  Genitourinary:  Negative for dysuria.  Musculoskeletal:  Negative for arthralgias, back pain and myalgias.  Skin:  Negative for color change, rash and wound.  Neurological:  Negative for dizziness, weakness and headaches.  Psychiatric/Behavioral:  Negative for behavioral problems. The patient is not nervous/anxious.     Health Maintenance  Topic Date Due   Hepatitis B Vaccines (1 of 3 - 19+ 3-dose series) Never done   HPV VACCINES (1 - 3-dose SCDM series) Never done   COVID-19 Vaccine (1 - 2024-25 season) 05/09/2024 (Originally 06/10/2023)   INFLUENZA VACCINE  05/09/2024  Cervical Cancer Screening (HPV/Pap Cotest)  08/26/2026   DTaP/Tdap/Td (3 - Td or Tdap) 06/19/2028   Hepatitis C Screening  Completed   HIV Screening  Completed   Meningococcal B Vaccine  Aged Out    Physical Exam: Vitals:   04/18/24 0845  BP: 118/75  Pulse: 67  Resp: 18  Temp: 97.8 F (36.6 C)  SpO2: 97%  Weight: 168 lb 6.4 oz (76.4 kg)  Height: 5' 2 (1.575 m)   Body mass index is 30.8 kg/m. Physical Exam Constitutional:       Appearance: She is obese.  HENT:     Head: Normocephalic and atraumatic.     Nose: Nose normal.     Mouth/Throat:     Mouth: Mucous membranes are moist.  Eyes:     Conjunctiva/sclera: Conjunctivae normal.  Cardiovascular:     Rate and Rhythm: Normal rate and regular rhythm.  Pulmonary:     Effort: Pulmonary effort is normal.     Breath sounds: Normal breath sounds.  Abdominal:     General: Bowel sounds are normal.     Palpations: Abdomen is soft.  Musculoskeletal:        General: Normal range of motion.     Cervical back: Normal range of motion.  Skin:    General: Skin is warm and dry.     Comments: Nexplanon  on left upper arm  Neurological:     General: No focal deficit present.     Mental Status: She is alert and oriented to person, place, and time.  Psychiatric:        Mood and Affect: Mood normal.        Behavior: Behavior normal.        Thought Content: Thought content normal.        Judgment: Judgment normal.     Labs reviewed: Basic Metabolic Panel: Recent Labs    06/14/23 0807 04/18/24 0900  NA 137 138  K 4.4 4.1  CL 104 107  CO2 25 25  GLUCOSE 92 89  BUN 9 9  CREATININE 0.85 0.86  CALCIUM  9.3 8.9   Liver Function Tests: Recent Labs    06/14/23 0807  AST 21  ALT 19  BILITOT 0.3  PROT 7.6   No results for input(s): LIPASE, AMYLASE in the last 8760 hours. No results for input(s): AMMONIA in the last 8760 hours. CBC: Recent Labs    06/14/23 0807 04/18/24 0900  WBC 5.4 5.8  NEUTROABS 3,456 4,060  HGB 13.7 12.7  HCT 41.5 39.0  MCV 89.2 93.1  PLT 326 289   Lipid Panel: Recent Labs    06/14/23 0807 09/14/23 0805 04/18/24 0900  CHOL 255* 212* 221*  HDL 55 53 60  LDLCALC 176* 144* 146*  TRIG 112 55 60  CHOLHDL 4.6 4.0 3.7   Lab Results  Component Value Date   HGBA1C 5.5 04/18/2024    Procedures since last visit: No results found.  Assessment/Plan  1. Mixed hyperlipidemia (Primary) -  Total cholesterol decreased from  255 mg/dL to 787 mg/dL, LDL from 823 mg/dL to 855 mg/dL over ten months. Maintains diet rich in vegetables and fruits, reduced carbohydrates and calories. - Lipid panel  2. Vitamin D  deficiency -  Vitamin D  level improved to 34 ng/mL nine months ago. Not taking supplements. - Vitamin D , 25-hydroxy  3. Prediabetes - A1c improved to 5.6 seven months ago due to regular physical activity. - CBC with Differential/Platelets - Basic metabolic panel - Hemoglobin A1c  Labs/tests ordered: A1c, vitamin D  level, lipid panel   Return in about 6 months (around 10/19/2024).  Hagen Tidd Medina-Vargas, NP

## 2024-04-19 LAB — LIPID PANEL
Cholesterol: 221 mg/dL — ABNORMAL HIGH (ref <200)
HDL: 60 mg/dL (ref >=50)
LDL Cholesterol (Calc): 146 mg/dL — ABNORMAL HIGH
Non-HDL Cholesterol (Calc): 161 mg/dL — ABNORMAL HIGH (ref <130)
Total CHOL/HDL Ratio: 3.7 (calc) (ref <5.0)
Triglycerides: 60 mg/dL (ref <150)

## 2024-04-19 LAB — HEMOGLOBIN A1C
Hgb A1c MFr Bld: 5.5 % (ref <5.7)
Mean Plasma Glucose: 111 mg/dL
eAG (mmol/L): 6.2 mmol/L

## 2024-04-19 LAB — VITAMIN D 25 HYDROXY (VIT D DEFICIENCY, FRACTURES): Vit D, 25-Hydroxy: 36 ng/mL (ref 30–100)

## 2024-04-19 LAB — CBC WITH DIFFERENTIAL/PLATELET
Absolute Lymphocytes: 1276 {cells}/uL (ref 850–3900)
Absolute Monocytes: 394 {cells}/uL (ref 200–950)
Basophils Absolute: 29 {cells}/uL (ref 0–200)
Basophils Relative: 0.5 %
Eosinophils Absolute: 41 {cells}/uL (ref 15–500)
Eosinophils Relative: 0.7 %
HCT: 39 % (ref 35.0–45.0)
Hemoglobin: 12.7 g/dL (ref 11.7–15.5)
MCH: 30.3 pg (ref 27.0–33.0)
MCHC: 32.6 g/dL (ref 32.0–36.0)
MCV: 93.1 fL (ref 80.0–100.0)
MPV: 9.6 fL (ref 7.5–12.5)
Monocytes Relative: 6.8 %
Neutro Abs: 4060 {cells}/uL (ref 1500–7800)
Neutrophils Relative %: 70 %
Platelets: 289 Thousand/uL (ref 140–400)
RBC: 4.19 Million/uL (ref 3.80–5.10)
RDW: 11.8 % (ref 11.0–15.0)
Total Lymphocyte: 22 %
WBC: 5.8 Thousand/uL (ref 3.8–10.8)

## 2024-04-19 LAB — BASIC METABOLIC PANEL WITH GFR
BUN: 9 mg/dL (ref 7–25)
CO2: 25 mmol/L (ref 20–32)
Calcium: 8.9 mg/dL (ref 8.6–10.2)
Chloride: 107 mmol/L (ref 98–110)
Creat: 0.86 mg/dL (ref 0.50–0.97)
Glucose, Bld: 89 mg/dL (ref 65–99)
Potassium: 4.1 mmol/L (ref 3.5–5.3)
Sodium: 138 mmol/L (ref 135–146)
eGFR: 90 mL/min/1.73m2 (ref >=60)

## 2024-04-23 ENCOUNTER — Other Ambulatory Visit: Payer: Self-pay | Admitting: Adult Health

## 2024-04-23 ENCOUNTER — Ambulatory Visit: Payer: Self-pay | Admitting: Adult Health

## 2024-04-23 DIAGNOSIS — E782 Mixed hyperlipidemia: Secondary | ICD-10-CM

## 2024-04-23 MED ORDER — ATORVASTATIN CALCIUM 40 MG PO TABS
40.0000 mg | ORAL_TABLET | Freq: Every day | ORAL | 3 refills | Status: AC
Start: 2024-04-23 — End: ?

## 2024-04-23 NOTE — Progress Notes (Signed)
-    sent eRx for Atorvastatin  to pharmacy.

## 2024-04-23 NOTE — Progress Notes (Signed)
-     Cholesterol 221, up from 212 -   LDL 146, up from 144, recommending statin, would you like eRx sent to your pharmacy and which pharmacy? - No anemia -  Vitamin D  level, electrolytes, A1c, all normal

## 2024-09-01 ENCOUNTER — Ambulatory Visit: Admitting: Obstetrics and Gynecology

## 2024-09-01 ENCOUNTER — Other Ambulatory Visit (HOSPITAL_COMMUNITY)
Admission: RE | Admit: 2024-09-01 | Discharge: 2024-09-01 | Disposition: A | Discharge status: Home / Self Care | Source: Ambulatory Visit | Attending: Obstetrics and Gynecology | Admitting: Obstetrics and Gynecology

## 2024-09-01 ENCOUNTER — Encounter: Payer: Self-pay | Admitting: Obstetrics and Gynecology

## 2024-09-01 VITALS — BP 125/83 | HR 69 | Ht 62.0 in | Wt 171.1 lb

## 2024-09-01 DIAGNOSIS — Z01419 Encounter for gynecological examination (general) (routine) without abnormal findings: Secondary | ICD-10-CM

## 2024-09-01 DIAGNOSIS — Z975 Presence of (intrauterine) contraceptive device: Secondary | ICD-10-CM | POA: Insufficient documentation

## 2024-09-01 DIAGNOSIS — Z113 Encounter for screening for infections with a predominantly sexual mode of transmission: Secondary | ICD-10-CM | POA: Diagnosis not present

## 2024-09-01 DIAGNOSIS — N921 Excessive and frequent menstruation with irregular cycle: Secondary | ICD-10-CM | POA: Insufficient documentation

## 2024-09-01 NOTE — Patient Instructions (Addendum)
 Ibuprofen  600mg  (3 normal strength tablets) every 6-8 hours for 5 days  If that doesn't work, I will send a prescription for lysteda (tranexamic acid).    [email or web address] - GREAT website about birth control if you want to look at other options

## 2024-09-01 NOTE — Progress Notes (Signed)
 Pt presents for annual. Pt is having irregular periods,Pt states that it stays on 3 weeks at a time. No other questions or concerns at this time. Pt declines std testing.

## 2024-09-01 NOTE — Progress Notes (Signed)
 ANNUAL EXAM Patient name: Leslie Cook MRN [id number]  Date of birth: [birth year] Chief Complaint:   No chief complaint on file.  History of Present Illness:   Leslie Cook is a 38 y.o. [id number] with Patient's last menstrual period was 08/24/2024 (exact date). being seen today for a routine annual exam.  Current complaints:   Irregular bleeding. Has nexplanon . Has been amenorrheic until she lost weight. Has liked nexplanon  up until now. Can't do estrogen - tobacco use No pills - can't remember to take them, got pregnant while taking them  Last pap 08/26/21 NILM/HPV neg. H/O abnormal pap: no Last mammogram: n/a Last colonoscopy: n/a HPV vaccine: completed per pt report     09/01/2024    1:59 PM 09/28/2023    3:02 PM 06/06/2023    2:28 PM 08/26/2021    8:22 AM 05/02/2018    8:29 AM  Depression screen PHQ 2/9  Decreased Interest 0 0 0 0 0  Down, Depressed, Hopeless 0 0 0 0 0  PHQ - 2 Score 0 0 0 0 0  Altered sleeping 0   0   Tired, decreased energy 0   0   Change in appetite 0   0   Feeling bad or failure about yourself  0   0   Trouble concentrating 0   0   Moving slowly or fidgety/restless 0   0   Suicidal thoughts 0   0   PHQ-9 Score 0   0    Difficult doing work/chores    Not difficult at all      Data saved with a previous flowsheet row definition        09/01/2024    2:00 PM  GAD 7 : Generalized Anxiety Score  Nervous, Anxious, on Edge 0  Control/stop worrying 0  Worry too much - different things 0  Trouble relaxing 0  Restless 0  Easily annoyed or irritable 0  Afraid - awful might happen 0  Total GAD 7 Score 0     Review of Systems:   Pertinent items are noted in HPI Denies any headaches, blurred vision, fatigue, shortness of breath, chest pain, abdominal pain, abnormal vaginal discharge/itching/odor/irritation, problems with periods, bowel movements, urination, or intercourse unless otherwise stated above. Pertinent History Reviewed:   Reviewed past medical,surgical, social and family history.  Reviewed problem list, medications and allergies. Physical Assessment:   Vitals:   09/01/24 1006  BP: 125/83  Pulse: 69  Weight: 171 lb 1.6 oz (77.6 kg)  Height: 5' 2 (1.575 m)  Body mass index is 31.29 kg/m.        Physical Examination:   General appearance - well appearing, and in no distress  Mental status - alert, oriented to person, place, and time  Chest - respiratory effort normal  Heart - normal peripheral perfusion  Breasts - offered, declined as she recently had exam with PCP  Abdomen - soft, nontender, nondistended, no masses or organomegaly  Pelvic - VULVA: normal appearing vulva with no masses, tenderness or lesions  VAGINA: normal appearing vagina with normal color and discharge, no lesions. Scant blood in vault. CERVIX: normal appearing cervix without discharge or lesions, no CMT  Chaperone present for exam  No results found for this or any previous visit (from the past 24 hours).  Assessment & Plan:  1) Well-Woman Exam Mammogram: @ 36yo, or sooner if problems Colonoscopy: @ 36yo, or sooner if problems Pap: Due 2027 Gardasil: Reports completing series GC/CT: Collected  HIV/HCV: Up to date Declines flu vaccine  2) Breakthrough bleeding on nexplanon  (placed 07/2021) No e/o cervicitis on exam, but swab collected to rule out. Discussed this is likely normal side effect of nexplanon  Will try ibuprofen  600mg  every 6-8 hours x 5 days to see if this is sufficient to stop this episode of light bleeding If not, will send rx for lysteda 1300mg  TID x 5 days She can't do estrogen containing methods due to tobacco use [employer] info given, discussed other options are available if BTB is bothersome. Could also try new nexplanon  as this one has been in place for 3 years (discussed contraceptive efficacy for up to 5 years)  Follow-up: Return in about 1 year (around 09/01/2025) for annual exam or sooner if bleeding  does not improve.  Kieth JAYSON Carolin, MD 09/01/2024 6:14 PM

## 2024-09-02 ENCOUNTER — Ambulatory Visit: Payer: Self-pay | Admitting: Obstetrics and Gynecology

## 2024-09-02 LAB — CERVICOVAGINAL ANCILLARY ONLY
Candida Glabrata: NEGATIVE
Candida Vaginitis: POSITIVE — AB
Chlamydia: NEGATIVE
Comment: NEGATIVE
Comment: NEGATIVE
Comment: NEGATIVE
Comment: NEGATIVE
Comment: NORMAL
Neisseria Gonorrhea: NEGATIVE
Trichomonas: NEGATIVE

## 2024-09-02 MED ORDER — FLUCONAZOLE 150 MG PO TABS
150.0000 mg | ORAL_TABLET | Freq: Once | ORAL | 0 refills | Status: AC
Start: 2024-09-02 — End: 2024-09-02

## 2024-09-11 DIAGNOSIS — F4323 Adjustment disorder with mixed anxiety and depressed mood: Secondary | ICD-10-CM | POA: Diagnosis not present

## 2024-09-16 DIAGNOSIS — F4323 Adjustment disorder with mixed anxiety and depressed mood: Secondary | ICD-10-CM | POA: Diagnosis not present

## 2024-10-24 ENCOUNTER — Ambulatory Visit: Payer: Self-pay | Admitting: Adult Health

## 2024-10-24 VITALS — BP 117/78 | HR 61 | Temp 98.0°F | Resp 18 | Ht 62.0 in | Wt 170.8 lb

## 2024-10-24 DIAGNOSIS — Z720 Tobacco use: Secondary | ICD-10-CM | POA: Diagnosis not present

## 2024-10-24 DIAGNOSIS — E782 Mixed hyperlipidemia: Secondary | ICD-10-CM | POA: Diagnosis not present

## 2024-10-24 DIAGNOSIS — N939 Abnormal uterine and vaginal bleeding, unspecified: Secondary | ICD-10-CM | POA: Diagnosis not present

## 2024-10-24 DIAGNOSIS — E66811 Obesity, class 1: Secondary | ICD-10-CM

## 2024-10-24 DIAGNOSIS — Z6831 Body mass index (BMI) 31.0-31.9, adult: Secondary | ICD-10-CM

## 2024-10-24 LAB — CBC WITH DIFFERENTIAL/PLATELET
Absolute Lymphocytes: 1915 {cells}/uL (ref 850–3900)
Absolute Monocytes: 720 {cells}/uL (ref 200–950)
Basophils Absolute: 29 {cells}/uL (ref 0–200)
Basophils Relative: 0.4 %
Eosinophils Absolute: 43 {cells}/uL (ref 15–500)
Eosinophils Relative: 0.6 %
HCT: 42 % (ref 35.9–46.0)
Hemoglobin: 13.8 g/dL (ref 11.7–15.5)
MCH: 30.1 pg (ref 27.0–33.0)
MCHC: 32.9 g/dL (ref 31.6–35.4)
MCV: 91.5 fL (ref 81.4–101.7)
MPV: 9.7 fL (ref 7.5–12.5)
Monocytes Relative: 10 %
Neutro Abs: 4493 {cells}/uL (ref 1500–7800)
Neutrophils Relative %: 62.4 %
Platelets: 393 Thousand/uL (ref 140–400)
RBC: 4.59 Million/uL (ref 3.80–5.10)
RDW: 11.7 % (ref 11.0–15.0)
Total Lymphocyte: 26.6 %
WBC: 7.2 Thousand/uL (ref 3.8–10.8)

## 2024-10-24 NOTE — Progress Notes (Signed)
 "  [facility]  Provider:  Jereld Serum DNP  Code Status:  Full Code  Goals of Care:     09/28/2023    3:11 PM  Advanced Directives  Does Patient Have a Medical Advance Directive? No  Would patient like information on creating a medical advance directive? No - Patient declined     Chief Complaint  Patient presents with   Medical Management of Chronic Issues    Six months follow-up    Discussed the use of AI scribe software for clinical note transcription with the patient, who gave verbal consent to proceed.  HPI: Patient is a 39 y.o. female seen today for a 33-month follow up of chronic medical issues.  She is currently taking atorvastatin  40 mg daily for her mixed hyperlipidemia, although she occasionally forgets doses. She has not had her cholesterol levels checked today as she ate yogurt prior to the visit and plans to have labs done in six months.  She is experiencing prolonged menstrual bleeding lasting almost three weeks. She uses the Nexplanon  implant for birth control, and her gynecologist suggested the bleeding might be related to this. Although the bleeding has not worsened, she continues to experience spotting and plans to follow up with her gynecologist if it persists.  She has a BMI of 31.24. She engages in physical activity, walking for about 30 minutes at least three times a week. Her weight has not increased since her last visit.  She has a history of smoking, currently smoking two to three cigarettes a day, and wants to quit within the next six months. She identifies as a stress smoker, particularly during the holiday season due to personal losses, including the recent passing of a cousin.  She reports good sleep quality and no symptoms of depression, although she experiences occasional sadness related to grief.    Past Medical History:  Diagnosis Date   Gestational diabetes    Gestational diabetes    Hypertension    Meningitis 2009    Past  Surgical History:  Procedure Laterality Date   NO PAST SURGERIES      Allergies[1]  Outpatient Encounter Medications as of 10/24/2024  Medication Sig   atorvastatin  (LIPITOR) 40 MG tablet Take 1 tablet (40 mg total) by mouth daily.   No facility-administered encounter medications on file as of 10/24/2024.    Review of Systems:  Review of Systems  Constitutional:  Negative for appetite change, chills, fatigue and fever.  HENT:  Negative for congestion, hearing loss, rhinorrhea and sore throat.   Eyes: Negative.   Respiratory:  Negative for cough, shortness of breath and wheezing.   Cardiovascular:  Negative for chest pain, palpitations and leg swelling.  Gastrointestinal:  Negative for abdominal pain, constipation, diarrhea, nausea and vomiting.  Genitourinary:  Negative for dysuria.  Musculoskeletal:  Negative for arthralgias, back pain and myalgias.  Skin:  Negative for color change, rash and wound.  Neurological:  Negative for dizziness, weakness and headaches.  Psychiatric/Behavioral:  Negative for behavioral problems. The patient is not nervous/anxious.     Health Maintenance  Topic Date Due   Hepatitis B Vaccines 19-59 Average Risk (1 of 3 - 19+ 3-dose series) Never done   Influenza Vaccine  06/24/2025 (Originally 05/09/2024)   COVID-19 Vaccine (1 - 2025-26 season) 10/08/2025 (Originally 06/09/2024)   Cervical Cancer Screening (HPV/Pap Cotest)  08/26/2026   DTaP/Tdap/Td (3 - Td or Tdap) 06/19/2028   HPV VACCINES (No Doses Required) Completed   Hepatitis C Screening  Completed  HIV Screening  Completed   Meningococcal B Vaccine  Aged Out   Pneumococcal Vaccine  Discontinued    Physical Exam: Vitals:   10/24/24 1250  BP: 117/78  Pulse: 61  Resp: 18  Temp: 98 F (36.7 C)  SpO2: 98%  Weight: 170 lb 12.8 oz (77.5 kg)  Height: 5' 2 (1.575 m)   Body mass index is 31.24 kg/m. Physical Exam Constitutional:      General: She is not in acute distress.    Appearance:  She is obese.  HENT:     Head: Normocephalic and atraumatic.     Nose: Nose normal.     Mouth/Throat:     Mouth: Mucous membranes are moist.  Eyes:     Conjunctiva/sclera: Conjunctivae normal.  Cardiovascular:     Rate and Rhythm: Normal rate and regular rhythm.  Pulmonary:     Effort: Pulmonary effort is normal.     Breath sounds: Normal breath sounds.  Abdominal:     General: Bowel sounds are normal.     Palpations: Abdomen is soft.  Musculoskeletal:        General: Normal range of motion.     Cervical back: Normal range of motion.  Skin:    General: Skin is warm and dry.     Comments: Left upper arm Nexplanon   Neurological:     General: No focal deficit present.     Mental Status: She is alert and oriented to person, place, and time.  Psychiatric:        Mood and Affect: Mood normal.        Behavior: Behavior normal.        Thought Content: Thought content normal.        Judgment: Judgment normal.     Labs reviewed: Basic Metabolic Panel: Recent Labs    04/18/24 0900  NA 138  K 4.1  CL 107  CO2 25  GLUCOSE 89  BUN 9  CREATININE 0.86  CALCIUM  8.9   Liver Function Tests: No results for input(s): AST, ALT, ALKPHOS, BILITOT, PROT, ALBUMIN in the last 8760 hours. No results for input(s): LIPASE, AMYLASE in the last 8760 hours. No results for input(s): AMMONIA in the last 8760 hours. CBC: Recent Labs    04/18/24 0900  WBC 5.8  NEUTROABS 4,060  HGB 12.7  HCT 39.0  MCV 93.1  PLT 289   Lipid Panel: Recent Labs    04/18/24 0900  CHOL 221*  HDL 60  LDLCALC 146*  TRIG 60  CHOLHDL 3.7   Lab Results  Component Value Date   HGBA1C 5.5 04/18/2024    Procedures since last visit: No results found.  Assessment/Plan  1. Mixed hyperlipidemia (Primary) - Managed with atorvastatin  40 mg daily. Cholesterol levels will be re-evaluated in six months. - Continue atorvastatin  40 mg daily. - Repeat cholesterol labs in six months.  2.  Vaginal bleeding -  Prolonged spotting possibly related to Nexplanon  birth control. Gynecologist advised follow-up if symptoms persist. - CBC with Differential/Platelets  3. Tobacco use -  Currently smoking 2-3 cigarettes per day. Plans to quit smoking in six months. - Encouraged smoking cessation.  4. Class 1 obesity with body mass index (BMI) of 31.0 to 31.9 in adult, unspecified obesity type, unspecified whether serious comorbidity present -  BMI 31.24. Exercises regularly. Goal to lose 15 pounds in six months. - Encouraged continued exercise and weight loss efforts. - Set goal to lose 15 pounds in six months.  Labs/tests ordered:   None   Return in about 6 months (around 04/23/2025).  Leslie Heideman Medina-Vargas, NP      [1] No Known Allergies  "

## 2024-10-24 NOTE — Progress Notes (Signed)
-   no anemia -  continue current medications

## 2025-04-24 ENCOUNTER — Ambulatory Visit: Admitting: Adult Health
